# Patient Record
Sex: Female | Born: 1960 | Race: White | Hispanic: No | Smoking: Never smoker
Health system: Southern US, Community
[De-identification: ages and names within clinical notes are randomized; demographics above are authoritative.]

## PROBLEM LIST (undated history)

## (undated) DIAGNOSIS — J189 Pneumonia, unspecified organism: Secondary | ICD-10-CM

## (undated) DIAGNOSIS — R51 Headache: Secondary | ICD-10-CM

## (undated) DIAGNOSIS — M1990 Unspecified osteoarthritis, unspecified site: Secondary | ICD-10-CM

## (undated) DIAGNOSIS — F419 Anxiety disorder, unspecified: Secondary | ICD-10-CM

## (undated) DIAGNOSIS — R519 Headache, unspecified: Secondary | ICD-10-CM

## (undated) DIAGNOSIS — F32A Depression, unspecified: Secondary | ICD-10-CM

## (undated) DIAGNOSIS — J45909 Unspecified asthma, uncomplicated: Secondary | ICD-10-CM

## (undated) DIAGNOSIS — T7840XA Allergy, unspecified, initial encounter: Secondary | ICD-10-CM

## (undated) HISTORY — DX: Headache, unspecified: R51.9

## (undated) HISTORY — DX: Unspecified asthma, uncomplicated: J45.909

## (undated) HISTORY — DX: Headache: R51

## (undated) HISTORY — DX: Allergy, unspecified, initial encounter: T78.40XA

## (undated) HISTORY — PX: COLONOSCOPY: SHX174

## (undated) HISTORY — PX: ESOPHAGOGASTRODUODENOSCOPY: SHX1529

---

## 2003-12-07 ENCOUNTER — Ambulatory Visit: Payer: Self-pay | Admitting: Obstetrics and Gynecology

## 2004-12-27 ENCOUNTER — Ambulatory Visit: Payer: Self-pay | Admitting: Obstetrics and Gynecology

## 2007-03-05 ENCOUNTER — Ambulatory Visit: Payer: Self-pay | Admitting: Obstetrics and Gynecology

## 2008-03-05 ENCOUNTER — Ambulatory Visit: Payer: Self-pay | Admitting: Obstetrics and Gynecology

## 2009-02-09 ENCOUNTER — Ambulatory Visit: Payer: Self-pay | Admitting: Family Medicine

## 2009-11-08 ENCOUNTER — Emergency Department: Payer: Self-pay | Admitting: Emergency Medicine

## 2010-03-02 IMAGING — CR DG CHEST 2V
1 series · 2 of 2 positions shown · non-contrast
Comparison: none

REASON FOR EXAM: cough
COMMENTS:

[Series 1: view not recorded · 0.17mm/px · 2 of 2 slices shown]
[im 1/2]
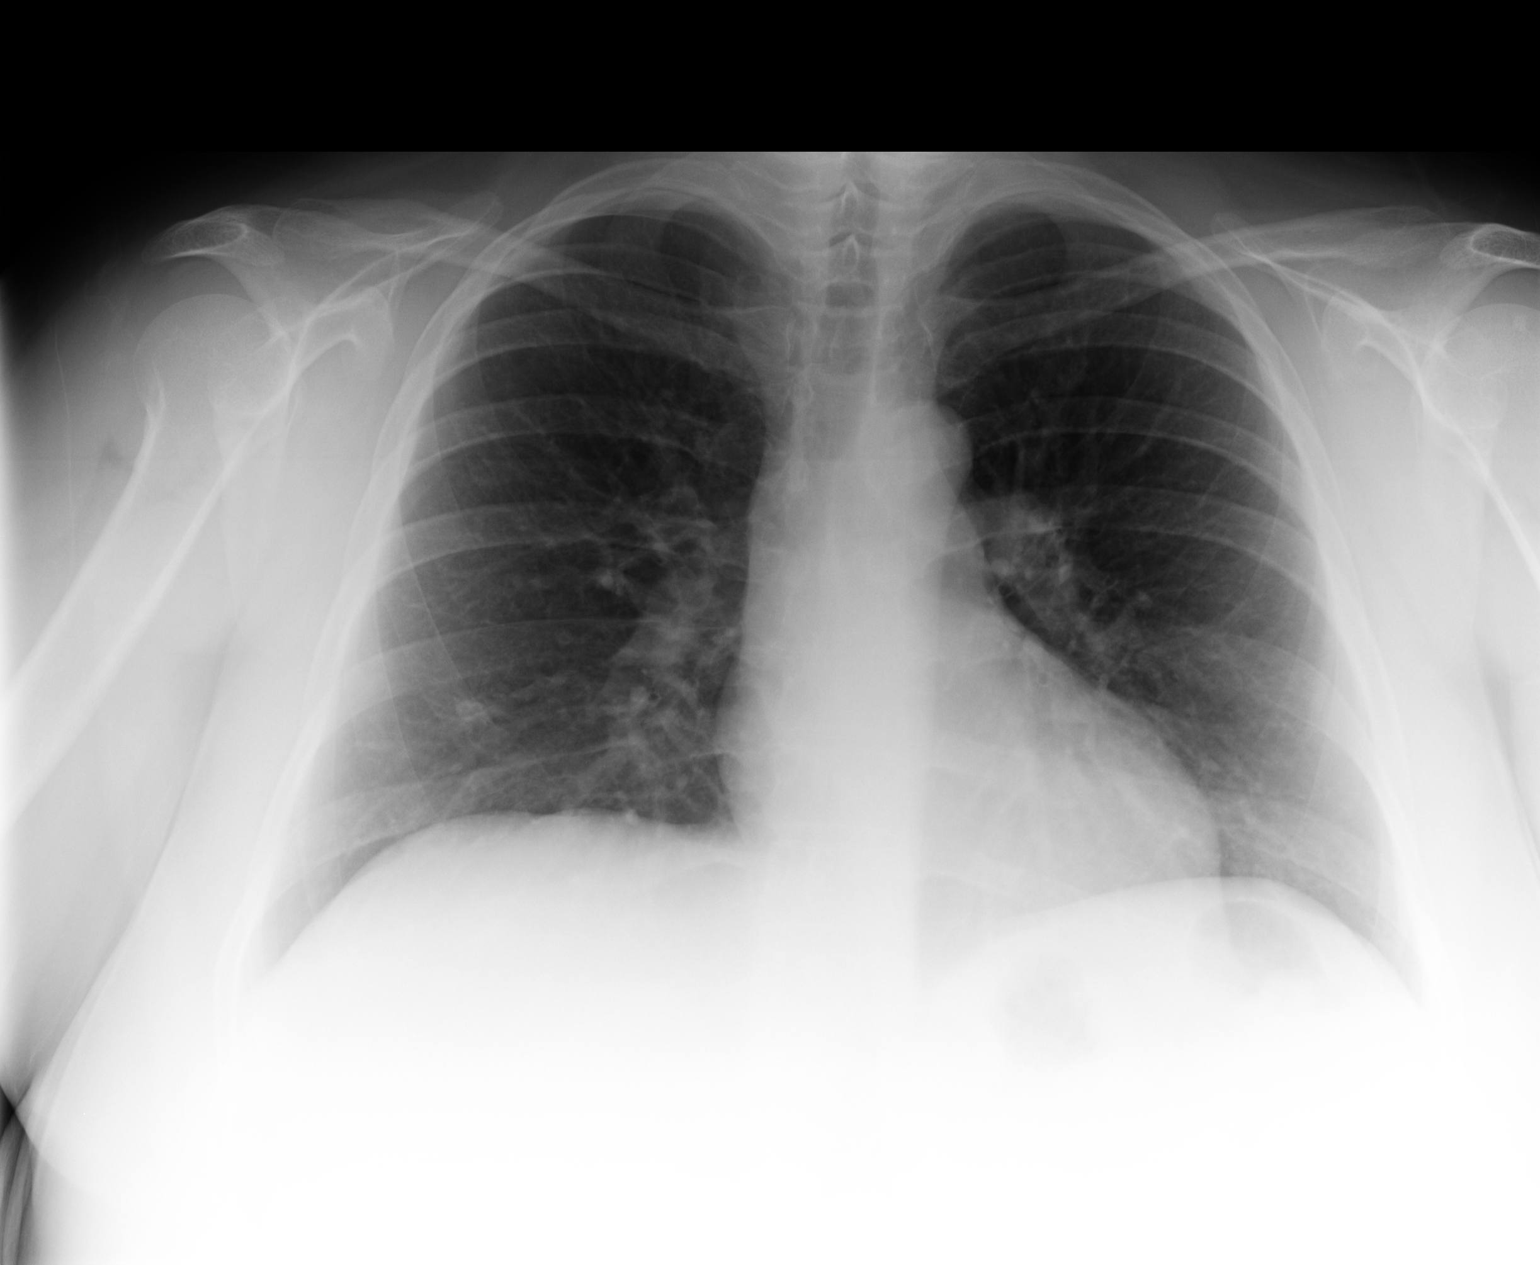
[im 2/2]
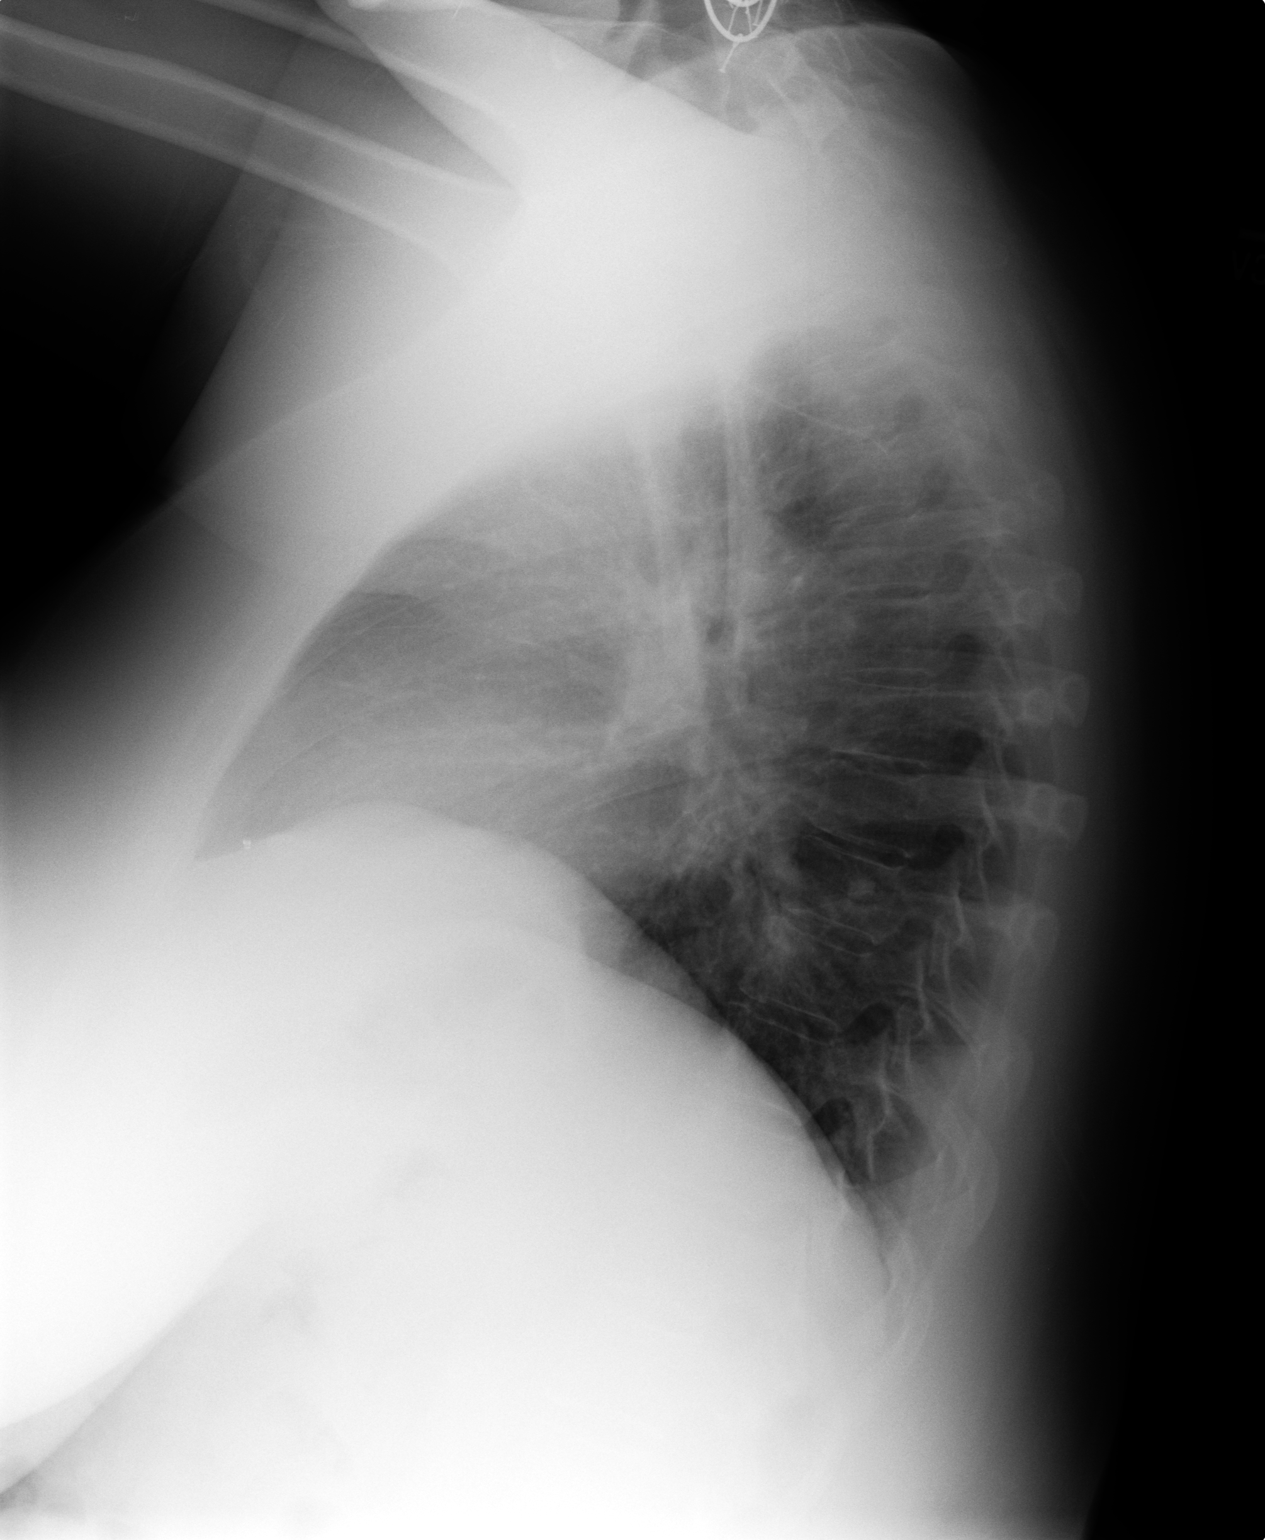

[2 of 2 positions shown; findings below may reference images not displayed]

PROCEDURE:     KDR - KDXR CHEST PA (OR AP) AND LAT  - February 09, 2009  [DATE]

RESULT:     There is a faint density projected over the fifth right anterior
rib. This is thought to represent a confluence of vascular and osseous
markings. The lung fields are clear of infiltrate. The heart, mediastinal
and osseous structures reveal no significant abnormalities.
IMPRESSION: No significant abnormalities are noted.

## 2011-12-28 LAB — HM HEPATITIS C SCREENING LAB: HM Hepatitis Screen: NEGATIVE

## 2011-12-29 ENCOUNTER — Ambulatory Visit: Payer: Self-pay | Admitting: Family Medicine

## 2012-11-07 ENCOUNTER — Ambulatory Visit: Payer: Self-pay | Admitting: Podiatry

## 2012-11-18 ENCOUNTER — Encounter: Payer: Self-pay | Admitting: Podiatry

## 2012-11-20 ENCOUNTER — Ambulatory Visit: Payer: Self-pay | Admitting: Podiatry

## 2012-12-04 ENCOUNTER — Ambulatory Visit (INDEPENDENT_AMBULATORY_CARE_PROVIDER_SITE_OTHER): Payer: BC Managed Care – PPO

## 2012-12-04 ENCOUNTER — Encounter: Payer: Self-pay | Admitting: Podiatry

## 2012-12-04 ENCOUNTER — Ambulatory Visit (INDEPENDENT_AMBULATORY_CARE_PROVIDER_SITE_OTHER): Payer: BC Managed Care – PPO | Admitting: Podiatry

## 2012-12-04 VITALS — BP 135/80 | HR 70 | Resp 16 | Ht 59.0 in

## 2012-12-04 DIAGNOSIS — M79672 Pain in left foot: Secondary | ICD-10-CM

## 2012-12-04 DIAGNOSIS — M722 Plantar fascial fibromatosis: Secondary | ICD-10-CM

## 2012-12-04 DIAGNOSIS — M79609 Pain in unspecified limb: Secondary | ICD-10-CM

## 2012-12-04 MED ORDER — MELOXICAM 15 MG PO TABS: 15.0000 mg | ORAL_TABLET | Freq: Every day | ORAL | Status: DC

## 2012-12-04 MED ORDER — METHYLPREDNISOLONE (PAK) 4 MG PO TABS: ORAL_TABLET | ORAL | Status: DC

## 2012-12-04 NOTE — Progress Notes (Signed)
Carolyn Walton presents today with a chief complaint of a painful left heel x1 year. Painful in the mornings with ambulation and then after she's been sitting for a while. She states it feels better when she wears her heels.  Objective: I have reviewed her past medical history medications and allergies. Vital signs are stable she is alert and oriented x3. Pulses remain palpable left lower extremity. Orthopedic evaluation demonstrates all joints distal to the ankle have a full range of motion without crepitus. She has no pain on medial lateral compression of the calcaneus. She does have pain on direct palpation of the medial calcaneal tubercle at the plantar fascial calcaneal insertion site. Radiographs confirm a plantar distally oriented calcaneal heel spur with soft tissue increase in density indicative of plantar fasciitis.  Assessment: Plantar fasciitis left.  Plan: We discussed the etiology pathology conservative versus surgical therapies. At this point we started her on a Medrol Dosepak for 6 days to be followed by Mobic 15 mg 30 in number one by mouth daily. We injected her left heel today and apply a plantar fascial strapping. Dispensed a night splint discussed appropriate shoe gear stretching exercises and ice therapy for which she was given both oral and written home-going instructions. I will followup with her in one month she will call with questions or concerns.

## 2012-12-04 NOTE — Patient Instructions (Signed)
Plantar Fasciitis (Heel Spur Syndrome) with Rehab The plantar fascia is a fibrous, ligament-like, soft-tissue structure that spans the bottom of the foot. Plantar fasciitis is a condition that causes pain in the foot due to inflammation of the tissue. SYMPTOMS   Pain and tenderness on the underneath side of the foot.  Pain that worsens with standing or walking. CAUSES  Plantar fasciitis is caused by irritation and injury to the plantar fascia on the underneath side of the foot. Common mechanisms of injury include:  Direct trauma to bottom of the foot.  Damage to a small nerve that runs under the foot where the main fascia attaches to the heel bone.  Stress placed on the plantar fascia due to bone spurs. RISK INCREASES WITH:   Activities that place stress on the plantar fascia (running, jumping, pivoting, or cutting).  Poor strength and flexibility.  Improperly fitted shoes.  Tight calf muscles.  Flat feet.  Failure to warm-up properly before activity.  Obesity. PREVENTION  Warm up and stretch properly before activity.  Allow for adequate recovery between workouts.  Maintain physical fitness:  Strength, flexibility, and endurance.  Cardiovascular fitness.  Maintain a health body weight.  Avoid stress on the plantar fascia.  Wear properly fitted shoes, including arch supports for individuals who have flat feet. PROGNOSIS  If treated properly, then the symptoms of plantar fasciitis usually resolve without surgery. However, occasionally surgery is necessary. RELATED COMPLICATIONS   Recurrent symptoms that may result in a chronic condition.  Problems of the lower back that are caused by compensating for the injury, such as limping.  Pain or weakness of the foot during push-off following surgery.  Chronic inflammation, scarring, and partial or complete fascia tear, occurring more often from repeated injections. TREATMENT  Treatment initially involves the use of  ice and medication to help reduce pain and inflammation. The use of strengthening and stretching exercises may help reduce pain with activity, especially stretches of the Achilles tendon. These exercises may be performed at home or with a therapist. Your caregiver may recommend that you use heel cups of arch supports to help reduce stress on the plantar fascia. Occasionally, corticosteroid injections are given to reduce inflammation. If symptoms persist for greater than 6 months despite non-surgical (conservative), then surgery may be recommended.  MEDICATION   If pain medication is necessary, then nonsteroidal anti-inflammatory medications, such as aspirin and ibuprofen, or other minor pain relievers, such as acetaminophen, are often recommended.  Do not take pain medication within 7 days before surgery.  Prescription pain relievers may be given if deemed necessary by your caregiver. Use only as directed and only as much as you need.  Corticosteroid injections may be given by your caregiver. These injections should be reserved for the most serious cases, because they may only be given a certain number of times. HEAT AND COLD  Cold treatment (icing) relieves pain and reduces inflammation. Cold treatment should be applied for 10 to 15 minutes every 2 to 3 hours for inflammation and pain and immediately after any activity that aggravates your symptoms. Use ice packs or massage the area with a piece of ice (ice massage).  Heat treatment may be used prior to performing the stretching and strengthening activities prescribed by your caregiver, physical therapist, or athletic trainer. Use a heat pack or soak the injury in warm water. SEEK IMMEDIATE MEDICAL CARE IF:  Treatment seems to offer no benefit, or the condition worsens.  Any medications produce adverse side effects. EXERCISES RANGE   OF MOTION (ROM) AND STRETCHING EXERCISES - Plantar Fasciitis (Heel Spur Syndrome) These exercises may help you  when beginning to rehabilitate your injury. Your symptoms may resolve with or without further involvement from your physician, physical therapist or athletic trainer. While completing these exercises, remember:   Restoring tissue flexibility helps normal motion to return to the joints. This allows healthier, less painful movement and activity.  An effective stretch should be held for at least 30 seconds.  A stretch should never be painful. You should only feel a gentle lengthening or release in the stretched tissue. RANGE OF MOTION - Toe Extension, Flexion  Sit with your right / left leg crossed over your opposite knee.  Grasp your toes and gently pull them back toward the top of your foot. You should feel a stretch on the bottom of your toes and/or foot.  Hold this stretch for __________ seconds.  Now, gently pull your toes toward the bottom of your foot. You should feel a stretch on the top of your toes and or foot.  Hold this stretch for __________ seconds. Repeat __________ times. Complete this stretch __________ times per day.  RANGE OF MOTION - Ankle Dorsiflexion, Active Assisted  Remove shoes and sit on a chair that is preferably not on a carpeted surface.  Place right / left foot under knee. Extend your opposite leg for support.  Keeping your heel down, slide your right / left foot back toward the chair until you feel a stretch at your ankle or calf. If you do not feel a stretch, slide your bottom forward to the edge of the chair, while still keeping your heel down.  Hold this stretch for __________ seconds. Repeat __________ times. Complete this stretch __________ times per day.  STRETCH  Gastroc, Standing  Place hands on wall.  Extend right / left leg, keeping the front knee somewhat bent.  Slightly point your toes inward on your back foot.  Keeping your right / left heel on the floor and your knee straight, shift your weight toward the wall, not allowing your back to  arch.  You should feel a gentle stretch in the right / left calf. Hold this position for __________ seconds. Repeat __________ times. Complete this stretch __________ times per day. STRETCH  Soleus, Standing  Place hands on wall.  Extend right / left leg, keeping the other knee somewhat bent.  Slightly point your toes inward on your back foot.  Keep your right / left heel on the floor, bend your back knee, and slightly shift your weight over the back leg so that you feel a gentle stretch deep in your back calf.  Hold this position for __________ seconds. Repeat __________ times. Complete this stretch __________ times per day. STRETCH  Gastrocsoleus, Standing  Note: This exercise can place a lot of stress on your foot and ankle. Please complete this exercise only if specifically instructed by your caregiver.   Place the ball of your right / left foot on a step, keeping your other foot firmly on the same step.  Hold on to the wall or a rail for balance.  Slowly lift your other foot, allowing your body weight to press your heel down over the edge of the step.  You should feel a stretch in your right / left calf.  Hold this position for __________ seconds.  Repeat this exercise with a slight bend in your right / left knee. Repeat __________ times. Complete this stretch __________ times per day.    STRENGTHENING EXERCISES - Plantar Fasciitis (Heel Spur Syndrome)  These exercises may help you when beginning to rehabilitate your injury. They may resolve your symptoms with or without further involvement from your physician, physical therapist or athletic trainer. While completing these exercises, remember:   Muscles can gain both the endurance and the strength needed for everyday activities through controlled exercises.  Complete these exercises as instructed by your physician, physical therapist or athletic trainer. Progress the resistance and repetitions only as guided. STRENGTH - Towel  Curls  Sit in a chair positioned on a non-carpeted surface.  Place your foot on a towel, keeping your heel on the floor.  Pull the towel toward your heel by only curling your toes. Keep your heel on the floor.  If instructed by your physician, physical therapist or athletic trainer, add ____________________ at the end of the towel. Repeat __________ times. Complete this exercise __________ times per day. STRENGTH - Ankle Inversion  Secure one end of a rubber exercise band/tubing to a fixed object (table, pole). Loop the other end around your foot just before your toes.  Place your fists between your knees. This will focus your strengthening at your ankle.  Slowly, pull your big toe up and in, making sure the band/tubing is positioned to resist the entire motion.  Hold this position for __________ seconds.  Have your muscles resist the band/tubing as it slowly pulls your foot back to the starting position. Repeat __________ times. Complete this exercises __________ times per day.  Document Released: 12/19/2004 Document Revised: 03/13/2011 Document Reviewed: 04/02/2008 [employer] Patient Information 2014 [employer]. Plantar Fasciitis Plantar fasciitis is a common condition that causes foot pain. It is soreness (inflammation) of the band of tough fibrous tissue on the bottom of the foot that runs from the heel bone (calcaneus) to the ball of the foot. The cause of this soreness may be from excessive standing, poor fitting shoes, running on hard surfaces, being overweight, having an abnormal walk, or overuse (this is common in runners) of the painful foot or feet. It is also common in aerobic exercise dancers and ballet dancers. SYMPTOMS  Most people with plantar fasciitis complain of:  Severe pain in the morning on the bottom of their foot especially when taking the first steps out of bed. This pain recedes after a few minutes of walking.  Severe pain is experienced also during walking  following a long period of inactivity.  Pain is worse when walking barefoot or up stairs DIAGNOSIS   Your caregiver will diagnose this condition by examining and feeling your foot.  Special tests such as X-rays of your foot, are usually not needed. PREVENTION   Consult a sports medicine professional before beginning a new exercise program.  Walking programs offer a good workout. With walking there is a lower chance of overuse injuries common to runners. There is less impact and less jarring of the joints.  Begin all new exercise programs slowly. If problems or pain develop, decrease the amount of time or distance until you are at a comfortable level.  Wear good shoes and replace them regularly.  Stretch your foot and the heel cords at the back of the ankle (Achilles tendon) both before and after exercise.  Run or exercise on even surfaces that are not hard. For example, asphalt is better than pavement.  Do not run barefoot on hard surfaces.  If using a treadmill, vary the incline.  Do not continue to workout if you have foot or joint   problems. Seek professional help if they do not improve. HOME CARE INSTRUCTIONS   Avoid activities that cause you pain until you recover.  Use ice or cold packs on the problem or painful areas after working out.  Only take over-the-counter or prescription medicines for pain, discomfort, or fever as directed by your caregiver.  Soft shoe inserts or athletic shoes with air or gel sole cushions may be helpful.  If problems continue or become more severe, consult a sports medicine caregiver or your own health care provider. Cortisone is a potent anti-inflammatory medication that may be injected into the painful area. You can discuss this treatment with your caregiver. MAKE SURE YOU:   Understand these instructions.  Will watch your condition.  Will get help right away if you are not doing well or get worse. Document Released: 09/13/2000 Document  Revised: 03/13/2011 Document Reviewed: 11/13/2007 [employer] Patient Information 2014 [employer].  

## 2013-01-01 ENCOUNTER — Ambulatory Visit (INDEPENDENT_AMBULATORY_CARE_PROVIDER_SITE_OTHER): Payer: BC Managed Care – PPO | Admitting: Podiatry

## 2013-01-01 ENCOUNTER — Encounter: Payer: Self-pay | Admitting: Podiatry

## 2013-01-01 DIAGNOSIS — M722 Plantar fascial fibromatosis: Secondary | ICD-10-CM

## 2013-01-01 NOTE — Progress Notes (Signed)
   Subjective:    Patient ID: Carolyn Walton, female    DOB: [birth year], 52 y.o.   MRN: [id number]  HPI Comments: Its been wonderful, left heel 90% better      Review of Systems     Objective:   Physical Exam: Vital signs are stable she is alert and oriented x3. She has no pain on palpation medial continued tubercle of the left heel.        Assessment & Plan:  Assessment: Resolving plantar fasciitis 90%.  Plan: Continue all conservative therapies and will followup with her on an as-needed basis.

## 2013-04-21 ENCOUNTER — Ambulatory Visit: Payer: BC Managed Care – PPO | Admitting: Podiatry

## 2013-04-30 ENCOUNTER — Encounter: Payer: Self-pay | Admitting: Podiatry

## 2013-04-30 ENCOUNTER — Ambulatory Visit (INDEPENDENT_AMBULATORY_CARE_PROVIDER_SITE_OTHER): Payer: BC Managed Care – PPO | Admitting: Podiatry

## 2013-04-30 ENCOUNTER — Other Ambulatory Visit: Payer: Self-pay | Admitting: *Deleted

## 2013-04-30 VITALS — BP 122/86 | HR 82 | Resp 16

## 2013-04-30 DIAGNOSIS — M722 Plantar fascial fibromatosis: Secondary | ICD-10-CM

## 2013-04-30 MED ORDER — DICLOFENAC SODIUM 75 MG PO TBEC: 75.0000 mg | DELAYED_RELEASE_TABLET | Freq: Two times a day (BID) | ORAL | Status: DC

## 2013-04-30 NOTE — Progress Notes (Signed)
She presents today complaining of bilateral heel pain. She continues all conservative therapies.  Objective: Vital signs are stable she is alert and oriented x3. She has pain on palpation medial continued tubercles bilateral. No calf pain. Pulses remain palpable.  Assessment: Plantar fasciitis bilateral.  Plan: Discussed etiology pathology conservative or surgical therapies. We performed orthotic scan today after bilateral injections. She was also dispensed a plantar fascial brace.

## 2013-10-10 ENCOUNTER — Emergency Department: Payer: Self-pay | Admitting: Emergency Medicine

## 2013-10-10 LAB — TROPONIN I: Troponin-I: <0.02 ng/mL

## 2013-10-10 LAB — CBC WITH DIFFERENTIAL/PLATELET
Basophil #: 0 10*3/uL (ref 0.0–0.1)
Basophil %: 0.5 %
Eosinophil #: 0.3 10*3/uL (ref 0.0–0.7)
Eosinophil %: 5.3 %
HCT: 42.8 % (ref 35.0–47.0)
HGB: 13.6 g/dL (ref 12.0–16.0)
Lymphocyte #: 1.7 10*3/uL (ref 1.0–3.6)
Lymphocyte %: 26.9 %
MCH: 28 pg (ref 26.0–34.0)
MCHC: 31.8 g/dL — ABNORMAL LOW (ref 32.0–36.0)
MCV: 88 fL (ref 80–100)
Monocyte #: 0.4 x10 3/mm (ref 0.2–0.9)
Monocyte %: 6.9 %
Neutrophil #: 3.9 10*3/uL (ref 1.4–6.5)
Neutrophil %: 60.4 %
Platelet: 173 10*3/uL (ref 150–440)
RBC: 4.86 10*6/uL (ref 3.80–5.20)
RDW: 13.5 % (ref 11.5–14.5)
WBC: 6.4 10*3/uL (ref 3.6–11.0)

## 2013-10-10 LAB — BASIC METABOLIC PANEL
Anion Gap: 8 (ref 7–16)
BUN: 15 mg/dL (ref 7–18)
CALCIUM: 9.2 mg/dL (ref 8.5–10.1)
CO2: 28 mmol/L (ref 21–32)
Chloride: 107 mmol/L (ref 98–107)
Creatinine: 0.7 mg/dL (ref 0.60–1.30)
EGFR (African American): >60
GLUCOSE: 81 mg/dL (ref 65–99)
Osmolality: 285 (ref 275–301)
Potassium: 3.9 mmol/L (ref 3.5–5.1)
SODIUM: 143 mmol/L (ref 136–145)

## 2013-10-10 LAB — URINALYSIS, COMPLETE
Bilirubin,UR: NEGATIVE
Blood: NEGATIVE
Glucose,UR: NEGATIVE mg/dL (ref 0–75)
Ketone: NEGATIVE
Nitrite: NEGATIVE
PH: 7 (ref 4.5–8.0)
Protein: NEGATIVE
RBC,UR: 2 /HPF (ref 0–5)
Specific Gravity: 1.004 (ref 1.003–1.030)
Squamous Epithelial: 3

## 2013-10-10 LAB — SEDIMENTATION RATE: ERYTHROCYTE SED RATE: 9 mm/h (ref 0–30)

## 2013-11-11 ENCOUNTER — Ambulatory Visit: Payer: Self-pay | Admitting: Family Medicine

## 2013-12-29 ENCOUNTER — Other Ambulatory Visit: Payer: Self-pay | Admitting: *Deleted

## 2013-12-29 ENCOUNTER — Telehealth: Payer: Self-pay | Admitting: *Deleted

## 2013-12-29 MED ORDER — DICLOFENAC SODIUM 75 MG PO TBEC: 75.0000 mg | DELAYED_RELEASE_TABLET | Freq: Two times a day (BID) | ORAL | Status: DC

## 2013-12-29 MED ORDER — MELOXICAM 15 MG PO TABS: 15.0000 mg | ORAL_TABLET | Freq: Every day | ORAL | Status: DC

## 2013-12-29 NOTE — Telephone Encounter (Signed)
Refill for diclofenac come from pharmacy ok to fill

## 2013-12-29 NOTE — Telephone Encounter (Signed)
We just got 2 e-scripts.  One was Meloxicam and the other was Diclofenac.  Of course they're both NSAIDS, I don't think she'll need both of them.  I don't know if the 2nd one replaced the first one.  I got the Meloxicam first then later the Diclofenac came through.  Call and let me know what to do.

## 2013-12-29 NOTE — Telephone Encounter (Signed)
Ok to refill mobic. 

## 2013-12-29 NOTE — Telephone Encounter (Signed)
Diclofenac 75mg . One by mouth twice daily. #60 3 rf

## 2014-04-11 ENCOUNTER — Ambulatory Visit
Admit: 2014-04-11 | Disposition: A | Discharge status: Home / Self Care | Payer: Self-pay | Attending: Orthopedic Surgery | Admitting: Orthopedic Surgery

## 2014-05-21 ENCOUNTER — Ambulatory Visit
Admission: RE | Admit: 2014-05-21 | Discharge: 2014-05-21 | Disposition: A | Discharge status: Home / Self Care | Payer: BC Managed Care – PPO | Source: Ambulatory Visit | Attending: Family Medicine | Admitting: Family Medicine

## 2014-05-21 ENCOUNTER — Other Ambulatory Visit: Payer: Self-pay | Admitting: Family Medicine

## 2014-05-21 DIAGNOSIS — J189 Pneumonia, unspecified organism: Secondary | ICD-10-CM

## 2014-05-21 DIAGNOSIS — Z8701 Personal history of pneumonia (recurrent): Secondary | ICD-10-CM | POA: Diagnosis not present

## 2014-05-21 DIAGNOSIS — Z09 Encounter for follow-up examination after completed treatment for conditions other than malignant neoplasm: Secondary | ICD-10-CM | POA: Diagnosis not present

## 2014-06-08 ENCOUNTER — Inpatient Hospital Stay: Admission: RE | Admit: 2014-06-08 | Payer: Self-pay | Source: Ambulatory Visit

## 2014-06-08 ENCOUNTER — Encounter: Payer: Self-pay | Admitting: *Deleted

## 2014-06-08 DIAGNOSIS — Z7952 Long term (current) use of systemic steroids: Secondary | ICD-10-CM | POA: Diagnosis not present

## 2014-06-08 DIAGNOSIS — Z79899 Other long term (current) drug therapy: Secondary | ICD-10-CM | POA: Diagnosis not present

## 2014-06-08 DIAGNOSIS — S83242A Other tear of medial meniscus, current injury, left knee, initial encounter: Secondary | ICD-10-CM | POA: Diagnosis present

## 2014-06-08 DIAGNOSIS — X58XXXA Exposure to other specified factors, initial encounter: Secondary | ICD-10-CM | POA: Diagnosis not present

## 2014-06-08 DIAGNOSIS — Z791 Long term (current) use of non-steroidal anti-inflammatories (NSAID): Secondary | ICD-10-CM | POA: Diagnosis not present

## 2014-06-08 DIAGNOSIS — J45909 Unspecified asthma, uncomplicated: Secondary | ICD-10-CM | POA: Diagnosis not present

## 2014-06-08 DIAGNOSIS — M1990 Unspecified osteoarthritis, unspecified site: Secondary | ICD-10-CM | POA: Diagnosis not present

## 2014-06-08 NOTE — Patient Instructions (Signed)
  Your procedure is scheduled on: 6/15/16Report to Day Surgery. To find out your arrival time please call [phone] between 1PM - 3PM on 06/16/14.  Remember: Instructions that are not followed completely may result in serious medical risk, up to and including death, or upon the discretion of your surgeon and anesthesiologist your surgery may need to be rescheduled.    _x___ 1. Do not eat food or drink liquids after midnight. No gum chewing or hard candies.     _x___ 2. No Alcohol for 24 hours before or after surgery.   ____ 3. Bring all medications with you on the day of surgery if instructed.    __x__ 4. Notify your doctor if there is any change in your medical condition     (cold, fever, infections).     Do not wear jewelry, make-up, hairpins, clips or nail polish.  Do not wear lotions, powders, or perfumes. You may wear deodorant.  Do not shave 48 hours prior to surgery. Men may shave face and neck.  Do not bring valuables to the hospital.    [facility] is not responsible for any belongings or valuables.               Contacts, dentures or bridgework may not be worn into surgery.  Leave your suitcase in the car. After surgery it may be brought to your room.  For patients admitted to the hospital, discharge time is determined by your                treatment team.   Patients discharged the day of surgery will not be allowed to drive home.   Please read over the following fact sheets that you were given:   Surgical Site Infection Prevention   ____ Take these medicines the morning of surgery with A SIP OF WATER:    1. montelukast  2.   3.   4.  5.  6.  ____ Fleet Enema (as directed)   ___x_ Use CHG Soap as directed  _x___ Use inhalers on the day of surgery  ____ Stop metformin 2 days prior to surgery    ____ Take 1/2 of usual insulin dose the night before surgery and none on the morning of surgery.   ____ Stop Coumadin/Plavix/aspirin on   __x__ Stop  Anti-inflammatories on 06/09/14   ____ Stop supplements until after surgery.    ____ Bring C-Pap to the hospital.

## 2014-06-15 ENCOUNTER — Encounter
Admission: RE | Admit: 2014-06-15 | Discharge: 2014-06-15 | Disposition: A | Discharge status: Home / Self Care | Payer: BC Managed Care – PPO | Source: Ambulatory Visit | Attending: Orthopedic Surgery | Admitting: Orthopedic Surgery

## 2014-06-15 DIAGNOSIS — S83242A Other tear of medial meniscus, current injury, left knee, initial encounter: Secondary | ICD-10-CM | POA: Diagnosis not present

## 2014-06-15 LAB — URINALYSIS COMPLETE WITH MICROSCOPIC (ARMC ONLY)
BILIRUBIN URINE: NEGATIVE
Glucose, UA: NEGATIVE mg/dL
Hgb urine dipstick: NEGATIVE
Ketones, ur: NEGATIVE mg/dL
LEUKOCYTES UA: NEGATIVE
Nitrite: NEGATIVE
Protein, ur: NEGATIVE mg/dL
Specific Gravity, Urine: 1.015 (ref 1.005–1.030)
pH: 6 (ref 5.0–8.0)

## 2014-06-15 LAB — BASIC METABOLIC PANEL
ANION GAP: 5 (ref 5–15)
BUN: 13 mg/dL (ref 6–20)
CALCIUM: 8.9 mg/dL (ref 8.9–10.3)
CO2: 29 mmol/L (ref 22–32)
Chloride: 105 mmol/L (ref 101–111)
Creatinine, Ser: 0.73 mg/dL (ref 0.44–1.00)
GFR calc Af Amer: >60 mL/min (ref >60)
GFR calc non Af Amer: >60 mL/min (ref >60)
Glucose, Bld: 94 mg/dL (ref 65–99)
Potassium: 3.8 mmol/L (ref 3.5–5.1)
Sodium: 139 mmol/L (ref 135–145)

## 2014-06-15 LAB — DIFFERENTIAL
BASOS PCT: 0 %
Basophils Absolute: 0 10*3/uL (ref 0–0.1)
EOS ABS: 0.4 10*3/uL (ref 0–0.7)
Eosinophils Relative: 5 %
Lymphocytes Relative: 24 %
Lymphs Abs: 1.8 10*3/uL (ref 1.0–3.6)
MONO ABS: 0.5 10*3/uL (ref 0.2–0.9)
MONOS PCT: 7 %
Neutro Abs: 4.6 10*3/uL (ref 1.4–6.5)
Neutrophils Relative %: 64 %

## 2014-06-15 LAB — APTT: aPTT: 33 seconds (ref 24–36)

## 2014-06-15 LAB — CBC
HCT: 43.4 % (ref 35.0–47.0)
Hemoglobin: 14.1 g/dL (ref 12.0–16.0)
MCH: 27.9 pg (ref 26.0–34.0)
MCHC: 32.4 g/dL (ref 32.0–36.0)
MCV: 86.1 fL (ref 80.0–100.0)
Platelets: 192 10*3/uL (ref 150–440)
RBC: 5.04 MIL/uL (ref 3.80–5.20)
RDW: 13.5 % (ref 11.5–14.5)
WBC: 7.2 10*3/uL (ref 3.6–11.0)

## 2014-06-15 LAB — PROTIME-INR
INR: 0.96
Prothrombin Time: 13 seconds (ref 11.4–15.0)

## 2014-06-17 ENCOUNTER — Encounter
Admission: RE | Disposition: A | Discharge status: Home / Self Care | Payer: Self-pay | Source: Ambulatory Visit | Attending: Orthopedic Surgery

## 2014-06-17 ENCOUNTER — Ambulatory Visit: Payer: BC Managed Care – PPO | Admitting: Anesthesiology

## 2014-06-17 ENCOUNTER — Encounter: Payer: Self-pay | Admitting: *Deleted

## 2014-06-17 ENCOUNTER — Ambulatory Visit
Admission: RE | Admit: 2014-06-17 | Discharge: 2014-06-17 | Disposition: A | Discharge status: Home / Self Care | Payer: BC Managed Care – PPO | Source: Ambulatory Visit | Attending: Orthopedic Surgery | Admitting: Orthopedic Surgery

## 2014-06-17 DIAGNOSIS — Z791 Long term (current) use of non-steroidal anti-inflammatories (NSAID): Secondary | ICD-10-CM | POA: Insufficient documentation

## 2014-06-17 DIAGNOSIS — Z79899 Other long term (current) drug therapy: Secondary | ICD-10-CM | POA: Insufficient documentation

## 2014-06-17 DIAGNOSIS — M1990 Unspecified osteoarthritis, unspecified site: Secondary | ICD-10-CM | POA: Insufficient documentation

## 2014-06-17 DIAGNOSIS — X58XXXA Exposure to other specified factors, initial encounter: Secondary | ICD-10-CM | POA: Insufficient documentation

## 2014-06-17 DIAGNOSIS — S83242A Other tear of medial meniscus, current injury, left knee, initial encounter: Secondary | ICD-10-CM | POA: Insufficient documentation

## 2014-06-17 DIAGNOSIS — J45909 Unspecified asthma, uncomplicated: Secondary | ICD-10-CM | POA: Insufficient documentation

## 2014-06-17 DIAGNOSIS — Z7952 Long term (current) use of systemic steroids: Secondary | ICD-10-CM | POA: Insufficient documentation

## 2014-06-17 HISTORY — PX: KNEE ARTHROSCOPY WITH MEDIAL MENISECTOMY: SHX5651

## 2014-06-17 HISTORY — DX: Unspecified osteoarthritis, unspecified site: M19.90

## 2014-06-17 SURGERY — ARTHROSCOPY, KNEE, WITH MEDIAL MENISCECTOMY
Anesthesia: General | Site: Knee | Laterality: Left | Wound class: Clean

## 2014-06-17 MED ORDER — ONDANSETRON HCL 4 MG/2ML IJ SOLN
INTRAMUSCULAR | Status: DC | PRN
  Administered 2014-06-17: 4 mg via INTRAVENOUS

## 2014-06-17 MED ORDER — PROPOFOL 10 MG/ML IV BOLUS
INTRAVENOUS | Status: DC | PRN
  Administered 2014-06-17: 200 mg via INTRAVENOUS

## 2014-06-17 MED ORDER — FENTANYL CITRATE (PF) 100 MCG/2ML IJ SOLN
INTRAMUSCULAR | Status: AC
  Administered 2014-06-17: 25 ug via INTRAVENOUS
  Filled 2014-06-17: qty 2

## 2014-06-17 MED ORDER — IPRATROPIUM-ALBUTEROL 0.5-2.5 (3) MG/3ML IN SOLN
3.0000 mL | Freq: Once | RESPIRATORY_TRACT | Status: AC
  Administered 2014-06-17: 3 mL via RESPIRATORY_TRACT

## 2014-06-17 MED ORDER — FAMOTIDINE 20 MG PO TABS
ORAL_TABLET | ORAL | Status: AC
  Administered 2014-06-17: 20 mg
  Filled 2014-06-17: qty 1

## 2014-06-17 MED ORDER — HYDROMORPHONE HCL 1 MG/ML IJ SOLN
0.5000 mg | INTRAMUSCULAR | Status: DC | PRN
  Administered 2014-06-17: 0.5 mg via INTRAVENOUS

## 2014-06-17 MED ORDER — ACETAMINOPHEN 10 MG/ML IV SOLN
INTRAVENOUS | Status: AC
  Filled 2014-06-17: qty 100

## 2014-06-17 MED ORDER — ONDANSETRON HCL 4 MG/2ML IJ SOLN: 4.0000 mg | Freq: Once | INTRAMUSCULAR | Status: DC | PRN

## 2014-06-17 MED ORDER — MIDAZOLAM HCL 2 MG/2ML IJ SOLN
INTRAMUSCULAR | Status: DC | PRN
  Administered 2014-06-17: 2 mg via INTRAVENOUS

## 2014-06-17 MED ORDER — HYDROCODONE-ACETAMINOPHEN 5-325 MG PO TABS: 1.0000 | ORAL_TABLET | ORAL | Status: DC | PRN

## 2014-06-17 MED ORDER — CHLORHEXIDINE GLUCONATE 4 % EX LIQD: 60.0000 mL | Freq: Once | CUTANEOUS | Status: DC

## 2014-06-17 MED ORDER — PROMETHAZINE HCL 12.5 MG PO TABS: 12.5000 mg | ORAL_TABLET | ORAL | Status: DC | PRN

## 2014-06-17 MED ORDER — HYDROMORPHONE HCL 1 MG/ML IJ SOLN
INTRAMUSCULAR | Status: AC
  Administered 2014-06-17: 0.5 mg via INTRAVENOUS
  Filled 2014-06-17: qty 1

## 2014-06-17 MED ORDER — SUCCINYLCHOLINE CHLORIDE 20 MG/ML IJ SOLN
INTRAMUSCULAR | Status: DC | PRN
  Administered 2014-06-17: 120 mg via INTRAVENOUS

## 2014-06-17 MED ORDER — BUPIVACAINE-EPINEPHRINE (PF) 0.25% -1:200000 IJ SOLN
INTRAMUSCULAR | Status: AC
  Filled 2014-06-17: qty 30

## 2014-06-17 MED ORDER — LIDOCAINE HCL (PF) 1 % IJ SOLN
INTRAMUSCULAR | Status: AC
  Filled 2014-06-17: qty 30

## 2014-06-17 MED ORDER — FENTANYL CITRATE (PF) 100 MCG/2ML IJ SOLN
25.0000 ug | INTRAMUSCULAR | Status: DC | PRN
  Administered 2014-06-17 (×4): 25 ug via INTRAVENOUS

## 2014-06-17 MED ORDER — CEFAZOLIN SODIUM-DEXTROSE 2-3 GM-% IV SOLR
INTRAVENOUS | Status: AC
  Administered 2014-06-17: 2 g via INTRAVENOUS
  Filled 2014-06-17: qty 50

## 2014-06-17 MED ORDER — LACTATED RINGERS IV SOLN
INTRAVENOUS | Status: DC
  Administered 2014-06-17: 13:00:00 via INTRAVENOUS

## 2014-06-17 MED ORDER — LIDOCAINE HCL (CARDIAC) 20 MG/ML IV SOLN
INTRAVENOUS | Status: DC | PRN
  Administered 2014-06-17: 100 mg via INTRAVENOUS

## 2014-06-17 MED ORDER — IPRATROPIUM-ALBUTEROL 0.5-2.5 (3) MG/3ML IN SOLN
RESPIRATORY_TRACT | Status: AC
  Administered 2014-06-17: 3 mL via RESPIRATORY_TRACT
  Filled 2014-06-17: qty 3

## 2014-06-17 MED ORDER — FENTANYL CITRATE (PF) 100 MCG/2ML IJ SOLN
INTRAMUSCULAR | Status: DC | PRN
  Administered 2014-06-17 (×4): 50 ug via INTRAVENOUS

## 2014-06-17 MED ORDER — CEFAZOLIN SODIUM-DEXTROSE 2-3 GM-% IV SOLR: 2.0000 g | INTRAVENOUS | Status: DC

## 2014-06-17 MED ORDER — LIDOCAINE HCL 1 % IJ SOLN
INTRAMUSCULAR | Status: DC | PRN
  Administered 2014-06-17: 5 mL

## 2014-06-17 MED ORDER — ACETAMINOPHEN 10 MG/ML IV SOLN
INTRAVENOUS | Status: DC | PRN
  Administered 2014-06-17: 1000 mg via INTRAVENOUS

## 2014-06-17 MED ORDER — BUPIVACAINE-EPINEPHRINE 0.25% -1:200000 IJ SOLN
INTRAMUSCULAR | Status: DC | PRN
  Administered 2014-06-17: 20 mL

## 2014-06-17 SURGICAL SUPPLY — 32 items
BRONCHOSCOPE PED SLIM DISP (MISCELLANEOUS) ×2 IMPLANT
BUR RADIUS 3.5 (BURR) IMPLANT
BUR RADIUS 4.0X18.5 (BURR) ×2 IMPLANT
COOLER POLAR GLACIER W/PUMP (MISCELLANEOUS) ×2 IMPLANT
DRAPE IMP U-DRAPE 54X76 (DRAPES) ×2 IMPLANT
DURAPREP 26ML APPLICATOR (WOUND CARE) ×6 IMPLANT
GAUZE PETRO XEROFOAM 1X8 (MISCELLANEOUS) ×2 IMPLANT
GAUZE SPONGE 4X4 12PLY STRL (GAUZE/BANDAGES/DRESSINGS) ×2 IMPLANT
GLOVE BIOGEL PI IND STRL 9 (GLOVE) ×1 IMPLANT
GLOVE BIOGEL PI INDICATOR 9 (GLOVE) ×1
GLOVE SURG 9.0 ORTHO LTXF (GLOVE) ×2 IMPLANT
GOWN STRL REUS W/ TWL LRG LVL3 (GOWN DISPOSABLE) ×1 IMPLANT
GOWN STRL REUS W/TWL 2XL LVL3 (GOWN DISPOSABLE) ×2 IMPLANT
GOWN STRL REUS W/TWL LRG LVL3 (GOWN DISPOSABLE) ×1
IV LACTATED RINGER IRRG 3000ML (IV SOLUTION) ×8
IV LR IRRIG 3000ML ARTHROMATIC (IV SOLUTION) ×8 IMPLANT
KIT RM TURNOVER STRD PROC AR (KITS) ×2 IMPLANT
MANIFOLD NEPTUNE II (INSTRUMENTS) ×2 IMPLANT
PACK ARTHROSCOPY KNEE (MISCELLANEOUS) ×2 IMPLANT
PAD ABD DERMACEA PRESS 5X9 (GAUZE/BANDAGES/DRESSINGS) ×4 IMPLANT
PAD WRAPON POLAR KNEE (MISCELLANEOUS) ×1 IMPLANT
SET TUBE SUCT SHAVER OUTFL 24K (TUBING) ×2 IMPLANT
SOL PREP PVP 2OZ (MISCELLANEOUS) ×2
SOLUTION PREP PVP 2OZ (MISCELLANEOUS) ×1 IMPLANT
STRIP CLOSURE SKIN 1/2X4 (GAUZE/BANDAGES/DRESSINGS) ×2 IMPLANT
SUT ETHILON 4-0 (SUTURE) ×1
SUT ETHILON 4-0 FS2 18XMFL BLK (SUTURE) ×1
SUTURE ETHLN 4-0 FS2 18XMF BLK (SUTURE) ×1 IMPLANT
TUBING ARTHRO INFLOW-ONLY STRL (TUBING) ×2 IMPLANT
WAND HAND CNTRL MULTIVAC 50 (MISCELLANEOUS) ×2 IMPLANT
WAND HAND CNTRL MULTIVAC 90 (MISCELLANEOUS) ×2 IMPLANT
WRAPON POLAR PAD KNEE (MISCELLANEOUS) ×2

## 2014-06-17 NOTE — Anesthesia Procedure Notes (Signed)
Procedure Name: Intubation Performed by: Malva Cogan Pre-anesthesia Checklist: Patient identified, Emergency Drugs available, Suction available, Timeout performed and Patient being monitored Patient Re-evaluated:Patient Re-evaluated prior to inductionOxygen Delivery Method: Circle system utilized Preoxygenation: Pre-oxygenation with 100% oxygen Intubation Type: IV induction, Rapid sequence and Cricoid Pressure applied Laryngoscope Size: Mac and 3 Grade View: Grade III Tube type: Oral Tube size: 7.0 mm Number of attempts: 2 Airway Equipment and Method: Bougie stylet Placement Confirmation: positive ETCO2,  CO2 detector,  breath sounds checked- equal and bilateral and ETT inserted through vocal cords under direct vision Secured at: 21 cm Tube secured with: Tape Difficulty Due To: Difficult Airway- due to anterior larynx

## 2014-06-17 NOTE — Op Note (Signed)
05/21/2014  2:42 PM  PATIENT:  Carolyn Walton  PRE-OPERATIVE DIAGNOSIS:  TEAR OF MEDIAL MENISCUS, LEFT KNEE  POST-OPERATIVE DIAGNOSIS:  Same  PROCEDURE:  LEFT KNEE ARTHROSCOPY WITH  Partial MEDIAL MENISECTOMY, synovectomy  SURGEON:  Thornton Park, MD  ANESTHESIA:   General  PREOPERATIVE INDICATIONS:  Carolyn Walton  54 y.o. female with a diagnosis of TEAR OF MEDIAL MENISCUS, LEFT KNEE, failed conservative management and elected for surgical management.   MRI had demonstrated a horizontal medial meniscus tear with a para-meniscal cyst. Patient's clinical symptoms have been worsening. They're affecting her activities of daily living including ambulation.  The risks benefits and alternatives were discussed with the patient preoperatively including the risks of infection, bleeding, nerve injury, knee stiffness, persistent pain, osteoarthritis and the need for further surgery. Medical  risks include DVT and pulmonary embolism, myocardial infarction, stroke, pneumonia, respiratory failure and death. The patient understood these risks and wished to proceed.  OPERATIVE FINDINGS: A tear of the body and posterior horn medial meniscus.  Parameniscal cyst and diffuse medial femoral condyle and patellofemoral cartilage wear.  OPERATIVE PROCEDURE: Patient was met in the preoperative area. The operative extremity was signed with the word yes and my initials according the hospital's correct site of surgery protocol.  She was brought in the operating room where she was placed supine on the operative table. She underwent general anesthesia with an endotracheal tube..  The patient was prepped and draped in a sterile fashion.  A timeout was performed to verify the patient's name, date of birth, medical record number, correct site of surgery correct procedure to be performed. It was also used to verify the patient had had received antibiotics that all appropriate instruments, and radiographic studies were  available in the room. Once all in attendance were in agreement case began.  Proposed arthroscopy incisions were drawn out with a surgical marker. These were injected with 1% lidocaine plain. An 11 blade was used to establish an inferior lateral and inferomedial portals. The inferomedial portal was created using a 18-gauge spinal needle under direct visualization.  A full diagnostic examination of the knee was performed including the suprapatellar pouch, patellofemoral joint, medial lateral compartments as well as the medial lateral gutters, the intercondylar notch in the posterior knee.  Findings on arthroscopy included diffuse synovitis and diffuse degenerative changes of the undersurface of the patella and medial femoral condyle. Patient had a torn medial meniscus involving the body and posterior horn of the medial meniscus. Horizontal tear exiting the inferior surface and extended to the periphery.  The lateral compartment had no evidence of lateral meniscus tear. There were grade 1 chondral changes in the lateral tibial plateau. The anterior cruciate ligament was intact.  Patient had the medial meniscal tear treated with a 4-0 resector shaver blade and straight duckbill basket instrument. The meniscus was debrided until a stable rim was achieved. A chondroplasty was performed of the medial femoral condyle, and undersurface of patella also using a 4-0 resector shaver blade. A partial synovectomy was also performed using a 4-0 resector shaver blade and 90 ArthroCare wand.   A blunt nerve hook was used to decompress the parameniscal cyst by penetrating the capsule of the knee at the meniscal capsular junction. Cyst fluid was observed draining into the knee joint and was debrided with the 4-0 resector.  The knee was then copiously lavaged and all meniscal debris was removed using the shaver. All arthroscopic  Instruments were then removed. The 2 arthroscopy portals were closed  with 4-0 nylon.  Steri-Strips were applied along with a dry sterile and compressive dressing.  A Polar Care was applied to the left knee.The patient was brought to the PACU in stable condition. I scrubbed and present the entire case and all sharp and instrument counts were correct at the conclusion the case. I spoke to the patient's family postoperatively to let them know the case it done without complication and the patient was stable in the recovery room.  Timoteo Gaul, MD

## 2014-06-17 NOTE — Transfer of Care (Signed)
Immediate Anesthesia Transfer of Care Note  Patient: Carolyn Walton  Procedure(s) Performed: Procedure(s) with comments: KNEE ARTHROSCOPY WITH MEDIAL MENISECTOMY (Left) - partial medial menisectomy  Patient Location: PACU  Anesthesia Type:General  Level of Consciousness: awake, alert  and oriented  Airway & Oxygen Therapy: Patient Spontanous Breathing and Patient connected to face mask oxygen  Post-op Assessment: Report given to RN and Post -op Vital signs reviewed and stable  Post vital signs: Reviewed and stable  Last Vitals:  Filed Vitals:   06/17/14 1500  BP: 140/73  Pulse: 73  Temp: 36.8 C  Resp: 17    Complications: No apparent anesthesia complications

## 2014-06-17 NOTE — H&P (Addendum)
PREOPERATIVE H&P  Chief Complaint: TEAR OF MEDIAL MENISCUS, left knee  HPI: Carolyn Walton is a 54 y.o. female who presents for preoperative history and physical with a diagnosis of TEAR OF LEFT KNEE MEDIAL MENISCUS. Symptoms are rated as moderate to severe, and have been worsening.  This is significantly impairing activities of daily living.  She has elected for surgical management after failing nonoperative management. Her MRI has demonstrated a horizontal tear of the body of the medial meniscus with a parameniscal cyst.  Past Medical History  Diagnosis Date  . Frequent headaches   . Allergy   . Asthma   . Arthritis    Past Surgical History  Procedure Laterality Date  . Cesarean section      times 2   History   Social History  . Marital Status: Married    Spouse Name: N/A  . Number of Children: N/A  . Years of Education: N/A   Social History Main Topics  . Smoking status: Never Smoker   . Smokeless tobacco: Never Used  . Alcohol Use: Yes     Comment: wine  . Drug Use: No  . Sexual Activity: Yes    Birth Control/ Protection: Post-menopausal, None   Other Topics Concern  . None   Social History Narrative   History reviewed. No pertinent family history. Allergies  Allergen Reactions  . Molds & Smuts   . Other     CATS AND DOGS   Prior to Admission medications   Medication Sig Start Date End Date Taking? Authorizing Provider  cetirizine (ZYRTEC) 10 MG tablet Take 10 mg by mouth daily.   Yes Historical Provider, MD  Cholecalciferol (VITAMIN D3) 5000 UNITS TABS Take 5,000 Units by mouth daily.   Yes Historical Provider, MD  Fluticasone-Salmeterol (ADVAIR) 250-50 MCG/DOSE AEPB Inhale 1 puff into the lungs daily.   Yes Historical Provider, MD  meloxicam (MOBIC) 15 MG tablet Take 15 mg by mouth daily.   Yes Historical Provider, MD  montelukast (SINGULAIR) 10 MG tablet Take 10 mg by mouth at bedtime.   Yes Historical Provider, MD  Albuterol (VENTOLIN IN) Inhale into the  lungs.    Historical Provider, MD  citalopram (CELEXA) 20 MG tablet  02/03/13   Historical Provider, MD  diclofenac (VOLTAREN) 75 MG EC tablet Take 1 tablet (75 mg total) by mouth 2 (two) times daily. 12/29/13   Max T Hyatt, DPM  methylPREDNIsolone (MEDROL DOSPACK) 4 MG tablet follow package directions 12/04/12   Max T Hyatt, DPM  naproxen (NAPROSYN) 500 MG tablet Take 500 mg by mouth 2 (two) times daily with a meal.    Historical Provider, MD  SUMAtriptan (IMITREX) 100 MG tablet  01/29/13   Historical Provider, MD  TAMIFLU 75 MG capsule  02/25/13   Historical Provider, MD  topiramate (TOPAMAX) 100 MG tablet Take 100 mg by mouth daily.    Historical Provider, MD     Positive ROS: All other systems have been reviewed and were otherwise negative with the exception of those mentioned in the HPI and as above.  Physical Exam: General: Alert, no acute distress, alert and oriented 3  Cardiovascular: Regular rate and rhythm, no murmurs rubs or gallops No pedal edema Respiratory: Clear to auscultation bilaterally, no wheezes rales or rhonchi No cyanosis, no use of accessory musculature GI: No organomegaly, abdomen is soft and non-tender, nondistended with positive bowel sounds Skin: Intact, No lesions in the area of chief complaint Neurologic: Sensation intact distally Psychiatric: Patient is competent  for consent with normal mood and affect Lymphatic: No axillary or cervical lymphadenopathy  MUSCULOSKELETAL: Left knee: Patient's skin is intact. She has range of motion from 0-110. She has point tenderness over the medial joint line. There is no ligamentous laxity. She has a positive McMurray's test. She has 5 out of 5 strength, 6, intact sensation to light touch in palpable pedal pulses. She still, crepitus but no grind or apprehension.  Assessment: TEAR OF MEDIAL MENISCUS, left knee  Plan: Plan for Procedure(s): Left KNEE ARTHROSCOPY WITH MEDIAL MENISECTOMY  I have discussed in detail the  diagnosis of a medial meniscus tear with the patient. She has failed nonoperative management and wishes to proceed with arthroscopic partial medial meniscectomy. I reviewed the details of surgery as well as the postoperative course with her.  I discussed the risks and benefits of surgery with the patient in my office prior to the date of surgery. She understands the risks include but are not limited to infection, bleeding requiring blood transfusion, nerve or blood vessel injury, malunion, nonunion, joint stiffness or loss of motion, persistent pain, weakness or instability, and the need for further surgery. Medical risks include but are not limited to DVT and pulmonary embolism, myocardial infarction, stroke, pneumonia, respiratory failure and death. Patient understood these risks and wished to proceed.   Juanell Fairly, MD   06/17/2014 12:25 PM

## 2014-06-17 NOTE — Discharge Instructions (Addendum)
Patient will use crutches for the next few days until they is able to walk without a limp. Patient may weight-bear as tolerated after 2-3 days. Patiient should elevate and ice their leg with Polar Care whenever possible over the next 3 days. Patient should keep the surgical dressing covered during showers for 3 days with either a plastic bag or Saran wrap. The bandage may be removed after 3 days. Leave Steri-Strips in place. Take enteric-coated aspirin 325 mg by mouth daily 6 weeks postop for DVT prophylaxis. AMBULATORY SURGERY  DISCHARGE INSTRUCTIONS   1) The drugs that you were given will stay in your system until tomorrow so for the next 24 hours you should not:  A) Drive an automobile B) Make any legal decisions C) Drink any alcoholic beverage   2) You may resume regular meals tomorrow.  Today it is better to start with liquids and gradually work up to solid foods.  You may eat anything you prefer, but it is better to start with liquids, then soup and crackers, and gradually work up to solid foods.   3) Please notify your doctor immediately if you have any unusual bleeding, trouble breathing, redness and pain at the surgery site, drainage, fever, or pain not relieved by medication.   Follow up appointment with Dr. Martha Clan is on 06/23/14 at 4:00 pm at his office.  [phone]

## 2014-06-17 NOTE — Anesthesia Preprocedure Evaluation (Signed)
Anesthesia Evaluation  Patient identified by MRN, date of birth, ID band Patient awake    Reviewed: Allergy & Precautions, NPO status , Patient's Chart, lab work & pertinent test results  Airway Mallampati: III  TM Distance: <3 FB Neck ROM: Full    Dental  (+) Chipped   Pulmonary asthma ,    Pulmonary exam normal       Cardiovascular negative cardio ROS Normal cardiovascular exam    Neuro/Psych  Headaches, negative psych ROS   GI/Hepatic negative GI ROS, Neg liver ROS,   Endo/Other  negative endocrine ROS  Renal/GU negative Renal ROS  negative genitourinary   Musculoskeletal  (+) Arthritis -, Osteoarthritis,    Abdominal Normal abdominal exam  (+) + obese,   Peds negative pediatric ROS (+)  Hematology negative hematology ROS (+)   Anesthesia Other Findings   Reproductive/Obstetrics negative OB ROS                             Anesthesia Physical Anesthesia Plan  ASA: III  Anesthesia Plan: General   Post-op Pain Management:    Induction: Intravenous, Rapid sequence and Cricoid pressure planned  Airway Management Planned: Oral ETT  Additional Equipment:   Intra-op Plan:   Post-operative Plan: Extubation in OR  Informed Consent: I have reviewed the patients History and Physical, chart, labs and discussed the procedure including the risks, benefits and alternatives for the proposed anesthesia with the patient or authorized representative who has indicated his/her understanding and acceptance.   Dental advisory given  Plan Discussed with: CRNA and Surgeon  Anesthesia Plan Comments:         Anesthesia Quick Evaluation

## 2014-06-17 NOTE — Anesthesia Postprocedure Evaluation (Signed)
  Anesthesia Post-op Note  Patient: Carolyn Walton  Procedure(s) Performed: Procedure(s) with comments: KNEE ARTHROSCOPY WITH MEDIAL MENISECTOMY (Left) - partial medial menisectomy  Anesthesia type:General  Patient location: PACU  Post pain: Pain level controlled  Post assessment: Post-op Vital signs reviewed, Patient's Cardiovascular Status Stable, Respiratory Function Stable, Patent Airway and No signs of Nausea or vomiting  Post vital signs: Reviewed and stable  Last Vitals:  Filed Vitals:   06/17/14 1530  BP: 140/73  Pulse: 66  Temp:   Resp: 12    Level of consciousness: awake, alert  and patient cooperative  Complications: No apparent anesthesia complications

## 2014-06-18 ENCOUNTER — Encounter: Payer: Self-pay | Admitting: Orthopedic Surgery

## 2014-06-25 ENCOUNTER — Other Ambulatory Visit: Payer: Self-pay | Admitting: *Deleted

## 2014-06-25 MED ORDER — MELOXICAM 15 MG PO TABS: 15.0000 mg | ORAL_TABLET | Freq: Every day | ORAL | Status: DC

## 2014-06-25 NOTE — Telephone Encounter (Signed)
Refill request from pharmacy for meloxicam ok to fill

## 2014-11-09 ENCOUNTER — Other Ambulatory Visit: Payer: Self-pay

## 2014-11-09 MED ORDER — MONTELUKAST SODIUM 10 MG PO TABS: 10.0000 mg | ORAL_TABLET | Freq: Every day | ORAL | Status: DC

## 2014-11-21 ENCOUNTER — Other Ambulatory Visit: Payer: Self-pay

## 2014-11-21 MED ORDER — ALBUTEROL SULFATE HFA 108 (90 BASE) MCG/ACT IN AERS: 2.0000 | INHALATION_SPRAY | RESPIRATORY_TRACT | Status: DC | PRN

## 2014-11-25 ENCOUNTER — Encounter: Payer: Self-pay | Admitting: Family Medicine

## 2014-11-25 ENCOUNTER — Ambulatory Visit (INDEPENDENT_AMBULATORY_CARE_PROVIDER_SITE_OTHER): Payer: Commercial Managed Care - HMO | Admitting: Family Medicine

## 2014-11-25 DIAGNOSIS — H548 Legal blindness, as defined in USA: Secondary | ICD-10-CM | POA: Insufficient documentation

## 2014-11-25 DIAGNOSIS — R609 Edema, unspecified: Secondary | ICD-10-CM | POA: Insufficient documentation

## 2014-11-25 DIAGNOSIS — J309 Allergic rhinitis, unspecified: Secondary | ICD-10-CM | POA: Insufficient documentation

## 2014-11-25 DIAGNOSIS — J45909 Unspecified asthma, uncomplicated: Secondary | ICD-10-CM | POA: Diagnosis not present

## 2014-11-25 DIAGNOSIS — M1990 Unspecified osteoarthritis, unspecified site: Secondary | ICD-10-CM | POA: Insufficient documentation

## 2014-11-25 DIAGNOSIS — IMO0001 Reserved for inherently not codable concepts without codable children: Secondary | ICD-10-CM | POA: Insufficient documentation

## 2014-11-25 DIAGNOSIS — F329 Major depressive disorder, single episode, unspecified: Secondary | ICD-10-CM | POA: Insufficient documentation

## 2014-11-25 DIAGNOSIS — K279 Peptic ulcer, site unspecified, unspecified as acute or chronic, without hemorrhage or perforation: Secondary | ICD-10-CM | POA: Insufficient documentation

## 2014-11-25 DIAGNOSIS — S83209A Unspecified tear of unspecified meniscus, current injury, unspecified knee, initial encounter: Secondary | ICD-10-CM | POA: Insufficient documentation

## 2014-11-25 DIAGNOSIS — G43909 Migraine, unspecified, not intractable, without status migrainosus: Secondary | ICD-10-CM | POA: Insufficient documentation

## 2014-11-25 DIAGNOSIS — Z23 Encounter for immunization: Secondary | ICD-10-CM

## 2014-11-25 DIAGNOSIS — K219 Gastro-esophageal reflux disease without esophagitis: Secondary | ICD-10-CM | POA: Insufficient documentation

## 2014-11-25 DIAGNOSIS — E668 Other obesity: Secondary | ICD-10-CM | POA: Insufficient documentation

## 2014-11-25 DIAGNOSIS — F32A Depression, unspecified: Secondary | ICD-10-CM | POA: Insufficient documentation

## 2014-11-25 DIAGNOSIS — Z780 Asymptomatic menopausal state: Secondary | ICD-10-CM | POA: Insufficient documentation

## 2014-11-25 DIAGNOSIS — J0100 Acute maxillary sinusitis, unspecified: Secondary | ICD-10-CM | POA: Insufficient documentation

## 2014-11-25 DIAGNOSIS — R030 Elevated blood-pressure reading, without diagnosis of hypertension: Secondary | ICD-10-CM

## 2014-11-25 MED ORDER — FLUTICASONE-SALMETEROL 250-50 MCG/DOSE IN AEPB: 1.0000 | INHALATION_SPRAY | Freq: Every day | RESPIRATORY_TRACT | Status: DC

## 2014-11-25 MED ORDER — ALBUTEROL SULFATE HFA 108 (90 BASE) MCG/ACT IN AERS: 2.0000 | INHALATION_SPRAY | RESPIRATORY_TRACT | Status: DC | PRN

## 2014-11-25 NOTE — Progress Notes (Signed)
Patient ID: Carolyn Walton, female   DOB: [birth year], 54 y.o.   MRN: [id number]    Subjective:  HPI Pt is here today because she would like to discuss weight loss surgery. She is unsure of which surgery she would like, she has had friends and family that have had the sleeve and gastric bypass. Pt would like to discuss with Dr. Sullivan Lone which one he thinks is best for her. Asthma and migraines are stable at present. Prior to Admission medications   Medication Sig Start Date End Date Taking? Authorizing Provider  albuterol (VENTOLIN HFA) 108 (90 BASE) MCG/ACT inhaler Inhale 2 puffs into the lungs every 4 (four) hours as needed for wheezing or shortness of breath. 11/21/14  Yes Richard Hulen Shouts., MD  cetirizine (ZYRTEC) 10 MG tablet Take 10 mg by mouth daily.   Yes Historical Provider, MD  doxylamine, Sleep, (UNISOM) 25 MG tablet Take 25 mg by mouth at bedtime as needed.   Yes Historical Provider, MD  Fluticasone-Salmeterol (ADVAIR) 250-50 MCG/DOSE AEPB Inhale 1 puff into the lungs daily.   Yes Historical Provider, MD  furosemide (LASIX) 20 MG tablet Take by mouth. 11/10/13  Yes Historical Provider, MD  montelukast (SINGULAIR) 10 MG tablet Take 1 tablet (10 mg total) by mouth at bedtime. 11/09/14  Yes Richard Hulen Shouts., MD  SUMAtriptan (IMITREX) 100 MG tablet  01/29/13  Yes Historical Provider, MD  citalopram (CELEXA) 20 MG tablet  02/03/13   Historical Provider, MD  meloxicam (MOBIC) 15 MG tablet Take 1 tablet (15 mg total) by mouth daily. Patient not taking: Reported on 11/25/2014 06/25/14   Max Maud Deed, DPM    Patient Active Problem List   Diagnosis Date Noted  . Acute antritis 11/25/2014  . Allergic rhinitis 11/25/2014  . Arthritis 11/25/2014  . 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 Clinical depression 11/25/2014  . Accumulation of fluid in tissues 11/25/2014  . Blood pressure elevated 11/25/2014  . Esophageal reflux 11/25/2014  . Legal blindness, as defined in [place] 11/25/2014  . Headache, migraine 11/25/2014  . Extreme obesity (HCC) 11/25/2014  . Asymptomatic postmenopausal status 11/25/2014  . Peptic ulcer 11/25/2014  . Current tear of meniscus 11/25/2014    Past Medical History  Diagnosis Date  . Frequent headaches   . Allergy   . Asthma   . Arthritis     Social History   Social History  . Marital Status: Married    Spouse Name: N/A  . Number of Children: N/A  . Years of Education: N/A   Occupational History  . Not on file.   Social History Main Topics  . Smoking status: Never Smoker   . Smokeless tobacco: Never Used  . Alcohol Use: Yes     Comment: wine occasionally  . Drug Use: No  . Sexual Activity: Yes    Birth Control/ Protection: Post-menopausal, None   Other Topics Concern  . Not on file   Social History Narrative    Allergies  Allergen Reactions  . Molds & Smuts   . Other     CATS AND DOGS    Review of Systems  Constitutional: Negative.   HENT: Negative.   Eyes: Negative.   Respiratory: Negative.   Cardiovascular: Negative.   Gastrointestinal: Negative.   Genitourinary: Negative.   Musculoskeletal: Negative.   Skin: Negative.   Neurological: Negative.   Endo/Heme/Allergies: Negative.   Psychiatric/Behavioral: Negative.      There is no immunization history on file for  this patient. Objective:  BP 144/82 mmHg  Pulse 80  Temp(Src) 97.7 F (36.5 C) (Oral)  Resp 16  Ht 4\' 11"  (1.499 m)  Wt 292 lb (132.45 kg)  BMI 58.95 kg/m2  Physical Exam  Constitutional: She is oriented to person, place, and time and well-developed, well-nourished, and in no distress.  HENT:  Head: Normocephalic and atraumatic.  Right Ear: External ear normal.  Left Ear: External ear normal.  Nose: Nose normal.  Eyes: Conjunctivae and EOM are normal. Pupils are equal, round, and reactive to light.  Neck: Normal range of motion. Neck supple.  Cardiovascular: Normal rate, regular rhythm, normal heart sounds and  intact distal pulses.   Pulmonary/Chest: Effort normal and breath sounds normal.  Mild inspiratory wheezes  Abdominal: Soft. Bowel sounds are normal.  Musculoskeletal: Normal range of motion.  Neurological: She is alert and oriented to person, place, and time. She has normal reflexes. Gait normal. GCS score is 15.  Skin: Skin is warm and dry.  Psychiatric: Mood, memory, affect and judgment normal.    Lab Results  Component Value Date   WBC 7.2 06/15/2014   HGB 14.1 06/15/2014   HCT 43.4 06/15/2014   PLT 192 06/15/2014   GLUCOSE 94 06/15/2014   INR 0.96 06/15/2014    CMP     Component Value Date/Time   NA 139 06/15/2014 1515   NA 143 10/10/2013 1131   K 3.8 06/15/2014 1515   K 3.9 10/10/2013 1131   CL 105 06/15/2014 1515   CL 107 10/10/2013 1131   CO2 29 06/15/2014 1515   CO2 28 10/10/2013 1131   GLUCOSE 94 06/15/2014 1515   GLUCOSE 81 10/10/2013 1131   BUN 13 06/15/2014 1515   BUN 15 10/10/2013 1131   CREATININE 0.73 06/15/2014 1515   CREATININE 0.70 10/10/2013 1131   CALCIUM 8.9 06/15/2014 1515   CALCIUM 9.2 10/10/2013 1131   GFRNONAA >60 06/15/2014 1515   GFRNONAA >60 10/10/2013 1131   GFRAA >60 06/15/2014 1515   GFRAA >60 10/10/2013 1131    Assessment and Plan :  1. Morbid obesity, unspecified obesity type (HCC) - Ambulatory referral to General Surgery Dr Wenda LowMatt Martin More than 50% of visit spent in counselling with pt/husband. 2. Asthma, unspecified asthma severity, uncomplicated - Fluticasone-Salmeterol (ADVAIR) 250-50 MCG/DOSE AEPB; Inhale 1 puff into the lungs daily.  Dispense: 60 each; Refill: 12 - Pneumonia vaccine given  3. Need for influenza vaccination - Flu Vaccine QUAD 36+ mos IM 4.Migraines Patient was seen and examined by Dr. Julieanne Mansonichard Gilbert, and noted scribed by Dimas ChyleBrittany Byrd, CMA  Julieanne Mansonichard Gilbert MD [facility] 11/25/2014 8:16 AM

## 2015-03-20 ENCOUNTER — Encounter: Payer: Self-pay | Admitting: Family Medicine

## 2015-03-20 ENCOUNTER — Ambulatory Visit (INDEPENDENT_AMBULATORY_CARE_PROVIDER_SITE_OTHER): Payer: Commercial Managed Care - HMO | Admitting: Family Medicine

## 2015-03-20 VITALS — BP 158/88 | HR 88 | Temp 97.7°F | Resp 16

## 2015-03-20 DIAGNOSIS — J4520 Mild intermittent asthma, uncomplicated: Secondary | ICD-10-CM

## 2015-03-20 DIAGNOSIS — J0100 Acute maxillary sinusitis, unspecified: Secondary | ICD-10-CM

## 2015-03-20 DIAGNOSIS — J069 Acute upper respiratory infection, unspecified: Secondary | ICD-10-CM

## 2015-03-20 MED ORDER — AMOXICILLIN-POT CLAVULANATE 875-125 MG PO TABS: 1.0000 | ORAL_TABLET | Freq: Two times a day (BID) | ORAL | Status: DC

## 2015-03-20 MED ORDER — HYDROCOD POLST-CPM POLST ER 10-8 MG/5ML PO SUER: 5.0000 mL | Freq: Two times a day (BID) | ORAL | Status: DC | PRN

## 2015-03-20 NOTE — Progress Notes (Signed)
Patient ID: Carolyn Walton, female   DOB: 02-04-1960, 55 y.o.   MRN: [id number]        Patient: Carolyn Walton Female    DOB: 1960-04-19   55 y.o.   MRN: [id number] Visit Date: 03/20/2015  Today's Provider: Megan Mans, MD   Chief Complaint  Patient presents with  . URI  . Sinusitis   Subjective:    URI  This is a new problem. The current episode started in the past 7 days. The problem has been gradually worsening (Pt reports some improvement until last night she started to worsening againl.). The fever has been present for less than 1 day (Had a fever Tuesday). Associated symptoms include congestion, coughing, headaches, a plugged ear sensation, rhinorrhea, sinus pain, a sore throat, swollen glands and wheezing. Pertinent negatives include no chest pain, diarrhea, ear pain, joint swelling, nausea, neck pain, sneezing or vomiting. She has tried decongestant for the symptoms.  Sinusitis This is a new problem. The current episode started in the past 7 days. The problem has been gradually worsening since onset. Associated symptoms include congestion, coughing, headaches, sinus pressure, a sore throat and swollen glands. Pertinent negatives include no ear pain, neck pain or sneezing. Past treatments include oral decongestants.       Allergies  Allergen Reactions  . Molds & Smuts   . Other     CATS AND DOGS   Previous Medications   ALBUTEROL (VENTOLIN HFA) 108 (90 BASE) MCG/ACT INHALER    Inhale 2 puffs into the lungs every 4 (four) hours as needed for wheezing or shortness of breath.   CETIRIZINE (ZYRTEC) 10 MG TABLET    Take 10 mg by mouth daily.   CITALOPRAM (CELEXA) 20 MG TABLET       DOXYLAMINE, SLEEP, (UNISOM) 25 MG TABLET    Take 25 mg by mouth at bedtime as needed. Reported on 03/20/2015   FLUTICASONE-SALMETEROL (ADVAIR) 250-50 MCG/DOSE AEPB    Inhale 1 puff into the lungs daily.   FUROSEMIDE (LASIX) 20 MG TABLET    Take by mouth.   MELOXICAM (MOBIC) 15 MG TABLET     Take 1 tablet (15 mg total) by mouth daily.   MONTELUKAST (SINGULAIR) 10 MG TABLET    Take 1 tablet (10 mg total) by mouth at bedtime.   SUMATRIPTAN (IMITREX) 100 MG TABLET        Review of Systems  HENT: Positive for congestion, nosebleeds, postnasal drip, rhinorrhea, sinus pressure, sore throat and voice change. Negative for drooling, ear discharge, ear pain, sneezing, tinnitus and trouble swallowing.   Eyes: Positive for discharge. Negative for photophobia, pain, redness, itching and visual disturbance.  Respiratory: Positive for cough, chest tightness and wheezing. Negative for apnea, choking and stridor.   Cardiovascular: Negative.  Negative for chest pain.  Gastrointestinal: Negative.  Negative for nausea, vomiting and diarrhea.  Endocrine: Negative.   Musculoskeletal: Negative for neck pain.  Skin: Negative.   Allergic/Immunologic: Positive for environmental allergies.  Neurological: Positive for headaches. Negative for dizziness and light-headedness.  Hematological: Negative.   Psychiatric/Behavioral: Negative.     Social History  Substance Use Topics  . Smoking status: Never Smoker   . Smokeless tobacco: Never Used  . Alcohol Use: Yes     Comment: wine occasionally   Objective:   BP 158/88 mmHg  Pulse 88  Temp(Src) 97.7 F (36.5 C) (Oral)  Resp 16  SpO2 98%  Physical Exam  Constitutional: She appears well-developed and well-nourished.  Obese white  female, pear-shaped,  in no acute distress but obviously does not feel well.  HENT:  Head: Normocephalic and atraumatic.  Right Ear: External ear normal.  Left Ear: External ear normal.  Nose: Nose normal.  Mouth/Throat: Oropharynx is clear and moist.  Mild maxillary sinus tenderness  Eyes: Conjunctivae are normal.  Neck: Neck supple.  Cardiovascular: Normal rate, regular rhythm and normal heart sounds.   Pulmonary/Chest: Effort normal. No respiratory distress. She has wheezes.  Mild inspiratory next door he  wheezes.  Lymphadenopathy:    She has no cervical adenopathy.  Skin: Skin is warm and dry.  Psychiatric: She has a normal mood and affect. Her behavior is normal. Judgment and thought content normal.        Assessment & Plan:     Asthmatic bronchitis Sinusitis Both of these issues are secondary to the upper respiratory infection. At this time we'll treat with Augmentin 875 twice a day for 10 days. We'll treat the cough with Tussionex. Continue fluids and expectorants. Continue inhalers. No steroids at this time are necessary. I have done the exam and reviewed the above chart and it is accurate to the best of my knowledge.       Richard Wendelyn BreslowGilbert Jr, MD  [facility]

## 2015-03-22 ENCOUNTER — Other Ambulatory Visit: Payer: Self-pay | Admitting: Family Medicine

## 2015-03-22 MED ORDER — TOPIRAMATE 100 MG PO TABS: 100.0000 mg | ORAL_TABLET | Freq: Every day | ORAL | Status: DC

## 2015-03-22 NOTE — Telephone Encounter (Signed)
Done-aa 

## 2015-03-22 NOTE — Telephone Encounter (Signed)
Okay to refill for one year. 

## 2015-03-22 NOTE — Telephone Encounter (Signed)
Ok to refill? It's not on patient's med list. Thanks!

## 2015-03-22 NOTE — Telephone Encounter (Signed)
Please review-aa 

## 2015-03-22 NOTE — Telephone Encounter (Signed)
Pt contacted office for refill request on the following medications: topiramate (TOPAMAX) 100 MG tablet to [facility]. Pt stated she forgot to ask about the refill when she was here Saturday. Pt stated that [facility] advised her they had sent a refill request on 03/19/15. Thanks TNP

## 2015-03-25 ENCOUNTER — Other Ambulatory Visit: Payer: Self-pay

## 2015-03-25 MED ORDER — SUMATRIPTAN SUCCINATE 100 MG PO TABS: 100.0000 mg | ORAL_TABLET | Freq: Once | ORAL | Status: DC

## 2015-03-25 NOTE — Telephone Encounter (Signed)
[facility] Please review-aa

## 2015-03-29 ENCOUNTER — Telehealth: Payer: Self-pay

## 2015-03-29 DIAGNOSIS — Z1239 Encounter for other screening for malignant neoplasm of breast: Secondary | ICD-10-CM

## 2015-03-29 NOTE — Telephone Encounter (Signed)
lmtcb-need to get date of her last mammogram to update-aa

## 2015-03-29 NOTE — Telephone Encounter (Signed)
Patient has not had one since 2012-order put in and patient will call to make that appt-aa

## 2015-12-03 ENCOUNTER — Other Ambulatory Visit: Payer: Self-pay

## 2015-12-03 MED ORDER — MONTELUKAST SODIUM 10 MG PO TABS: 10.0000 mg | ORAL_TABLET | Freq: Every day | ORAL | 0 refills | Status: DC

## 2016-06-01 ENCOUNTER — Other Ambulatory Visit (HOSPITAL_COMMUNITY)
Admission: RE | Admit: 2016-06-01 | Discharge: 2016-06-01 | Disposition: A | Discharge status: Home / Self Care | Payer: Commercial Managed Care - HMO | Source: Ambulatory Visit | Attending: Obstetrics & Gynecology | Admitting: Obstetrics & Gynecology

## 2016-06-01 ENCOUNTER — Other Ambulatory Visit: Payer: Self-pay | Admitting: Obstetrics & Gynecology

## 2016-06-01 DIAGNOSIS — Z01411 Encounter for gynecological examination (general) (routine) with abnormal findings: Secondary | ICD-10-CM | POA: Insufficient documentation

## 2016-06-01 DIAGNOSIS — R102 Pelvic and perineal pain: Secondary | ICD-10-CM | POA: Diagnosis not present

## 2016-06-06 LAB — CYTOLOGY - PAP
Diagnosis: NEGATIVE
HPV: NOT DETECTED

## 2016-06-08 DIAGNOSIS — R102 Pelvic and perineal pain: Secondary | ICD-10-CM | POA: Diagnosis not present

## 2016-06-21 ENCOUNTER — Other Ambulatory Visit: Payer: Self-pay | Admitting: Family Medicine

## 2016-06-21 MED ORDER — MONTELUKAST SODIUM 10 MG PO TABS: 10.0000 mg | ORAL_TABLET | Freq: Every day | ORAL | 3 refills | Status: DC

## 2016-06-21 NOTE — Telephone Encounter (Signed)
[facility] faxed a request for the following medication. Thanks CC  montelukast (SINGULAIR) 10 MG tablet  >Take one tablet by mouth every night at bedtime. Qty: 90

## 2016-06-21 NOTE — Telephone Encounter (Signed)
Done-aa 

## 2016-06-23 ENCOUNTER — Other Ambulatory Visit: Payer: Self-pay

## 2016-06-23 MED ORDER — MONTELUKAST SODIUM 10 MG PO TABS: 10.0000 mg | ORAL_TABLET | Freq: Every day | ORAL | 3 refills | Status: DC

## 2016-06-26 ENCOUNTER — Ambulatory Visit: Payer: Commercial Managed Care - HMO | Admitting: Family Medicine

## 2016-06-27 ENCOUNTER — Encounter: Payer: Self-pay | Admitting: Family Medicine

## 2016-06-27 ENCOUNTER — Ambulatory Visit (INDEPENDENT_AMBULATORY_CARE_PROVIDER_SITE_OTHER): Payer: 59 | Admitting: Family Medicine

## 2016-06-27 VITALS — BP 126/76 | HR 82 | Temp 97.7°F | Resp 16 | Wt 273.0 lb

## 2016-06-27 DIAGNOSIS — G43909 Migraine, unspecified, not intractable, without status migrainosus: Secondary | ICD-10-CM

## 2016-06-27 DIAGNOSIS — J4520 Mild intermittent asthma, uncomplicated: Secondary | ICD-10-CM

## 2016-06-27 DIAGNOSIS — J309 Allergic rhinitis, unspecified: Secondary | ICD-10-CM

## 2016-06-27 MED ORDER — MONTELUKAST SODIUM 10 MG PO TABS: 10.0000 mg | ORAL_TABLET | Freq: Every day | ORAL | 3 refills | Status: DC

## 2016-06-27 MED ORDER — FLUTICASONE-SALMETEROL 250-50 MCG/DOSE IN AEPB: 1.0000 | INHALATION_SPRAY | Freq: Every day | RESPIRATORY_TRACT | 3 refills | Status: DC

## 2016-06-27 MED ORDER — TOPIRAMATE 100 MG PO TABS: 100.0000 mg | ORAL_TABLET | Freq: Every day | ORAL | 3 refills | Status: DC

## 2016-06-27 NOTE — Progress Notes (Signed)
Subjective:  HPI   Pt is here today for a follow up. She is needing refills on all her medications.   Asthma: She reports that mornings have been worse since being out of medication, but no flares.   Migraines: She is taking Topamax and migraines has been better. She reports that she has stopped all gluten. She started this in October. She says she feels a lot better and seems that her migraines are better due to this. She has also lost 19 pounds since she as in last (11/2015). " Its amazing how much better I feel since I stopped eating gluten". She also stopped sugar. She exercising some, walking up stairs.   Prior to Admission medications   Medication Sig Start Date End Date Taking? Authorizing Provider  albuterol (VENTOLIN HFA) 108 (90 BASE) MCG/ACT inhaler Inhale 2 puffs into the lungs every 4 (four) hours as needed for wheezing or shortness of breath. 11/25/14   Maple Hudson., MD  amoxicillin-clavulanate (AUGMENTIN) 875-125 MG tablet Take 1 tablet by mouth 2 (two) times daily. 03/20/15   Maple Hudson., MD  cetirizine (ZYRTEC) 10 MG tablet Take 10 mg by mouth daily.    [provider]  chlorpheniramine-HYDROcodone (TUSSIONEX PENNKINETIC ER) 10-8 MG/5ML SUER Take 5 mLs by mouth every 12 (twelve) hours as needed for cough. 03/20/15   Maple Hudson., MD  citalopram (CELEXA) 20 MG tablet  02/03/13   [provider]  doxylamine, Sleep, (UNISOM) 25 MG tablet Take 25 mg by mouth at bedtime as needed. Reported on 03/20/2015    [provider]  Fluticasone-Salmeterol (ADVAIR) 250-50 MCG/DOSE AEPB Inhale 1 puff into the lungs daily. 11/25/14   Maple Hudson., MD  furosemide (LASIX) 20 MG tablet Take by mouth. 11/10/13   [provider]  meloxicam (MOBIC) 15 MG tablet Take 1 tablet (15 mg total) by mouth daily. Patient not taking: Reported on 11/25/2014 06/25/14   Hyatt, Max T, DPM  montelukast (SINGULAIR) 10 MG tablet Take 1 tablet  (10 mg total) by mouth at bedtime. 06/23/16   Maple Hudson., MD  SUMAtriptan (IMITREX) 100 MG tablet Take 1 tablet (100 mg total) by mouth once. 03/25/15   Maple Hudson., MD  topiramate (TOPAMAX) 100 MG tablet Take 1 tablet (100 mg total) by mouth daily. 03/22/15   Maple Hudson., MD    Patient Active Problem List   Diagnosis Date Noted  . Acute antritis 11/25/2014  . Allergic rhinitis 11/25/2014  . Arthritis 11/25/2014  . 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 Clinical depression 11/25/2014  . Accumulation of fluid in tissues 11/25/2014  . Blood pressure elevated 11/25/2014  . Esophageal reflux 11/25/2014  . Legal blindness, as defined in [place] 11/25/2014  . Headache, migraine 11/25/2014  . Extreme obesity 11/25/2014  . Asymptomatic postmenopausal status 11/25/2014  . Peptic ulcer 11/25/2014  . Current tear of meniscus 11/25/2014    Past Medical History:  Diagnosis Date  . Allergy   . Arthritis   . Asthma   . Frequent headaches     Social History   Social History  . Marital status: Married    Spouse name: N/A  . Number of children: N/A  . Years of education: N/A   Occupational History  . Not on file.   Social History Main Topics  . Smoking status: Never Smoker  . Smokeless tobacco: Never Used  . Alcohol use Yes  Comment: wine occasionally  . Drug use: No  . Sexual activity: Yes    Birth control/ protection: Post-menopausal, None   Other Topics Concern  . Not on file   Social History Narrative  . No narrative on file    Allergies  Allergen Reactions  . Molds & Smuts   . Other     CATS AND DOGS    Review of Systems  Constitutional: Negative.   HENT: Negative.   Eyes: Negative.   Respiratory: Positive for wheezing.   Cardiovascular: Negative.   Gastrointestinal: Negative.   Genitourinary: Negative.   Musculoskeletal: Negative.   Skin: Negative.   Neurological: Positive for headaches.    Endo/Heme/Allergies: Negative.   Psychiatric/Behavioral: Negative.     Immunization History  Administered Date(s) Administered  . Influenza,inj,Quad PF,36+ Mos 11/25/2014  . Pneumococcal Conjugate-13 11/25/2014  . Pneumococcal Polysaccharide-23 09/29/2008  . Tdap 03/08/2010, 11/10/2013    Objective:  BP 126/76 (BP Location: Left Arm, Patient Position: Sitting, Cuff Size: Large)   Pulse 82   Temp 97.7 F (36.5 C) (Oral)   Resp 16   Wt 273 lb (123.8 kg)   SpO2 97%   BMI 55.14 kg/m   Physical Exam  Constitutional: She is oriented to person, place, and time and well-developed, well-nourished, and in no distress.  Eyes: Conjunctivae and EOM are normal. Pupils are equal, round, and reactive to light.  Neck: Normal range of motion. Neck supple.  Cardiovascular: Normal rate, regular rhythm, normal heart sounds and intact distal pulses.   Pulmonary/Chest: Effort normal and breath sounds normal.  Musculoskeletal: Normal range of motion.  Neurological: She is alert and oriented to person, place, and time. She has normal reflexes. Gait normal. GCS score is 15.  Skin: Skin is warm and dry.  Psychiatric: Mood, memory, affect and judgment normal.    Lab Results  Component Value Date   WBC 7.2 06/15/2014   HGB 14.1 06/15/2014   HCT 43.4 06/15/2014   PLT 192 06/15/2014   GLUCOSE 94 06/15/2014   INR 0.96 06/15/2014    CMP     Component Value Date/Time   NA 139 06/15/2014 1515   NA 143 10/10/2013 1131   K 3.8 06/15/2014 1515   K 3.9 10/10/2013 1131   CL 105 06/15/2014 1515   CL 107 10/10/2013 1131   CO2 29 06/15/2014 1515   CO2 28 10/10/2013 1131   GLUCOSE 94 06/15/2014 1515   GLUCOSE 81 10/10/2013 1131   BUN 13 06/15/2014 1515   BUN 15 10/10/2013 1131   CREATININE 0.73 06/15/2014 1515   CREATININE 0.70 10/10/2013 1131   CALCIUM 8.9 06/15/2014 1515   CALCIUM 9.2 10/10/2013 1131   GFRNONAA >60 06/15/2014 1515   GFRNONAA >60 10/10/2013 1131   GFRAA >60 06/15/2014 1515    GFRAA >60 10/10/2013 1131    Assessment and Plan :  1. Migraine without status migrainosus, not intractable, unspecified migraine type Stable. Medications pulled down for refill purposes only.  - topiramate (TOPAMAX) 100 MG tablet; Take 1 tablet (100 mg total) by mouth daily.  Dispense: 90 tablet; Refill: 3  2. Allergic rhinitis, unspecified seasonality, unspecified trigger   3. Mild intermittent asthma without complication  - montelukast (SINGULAIR) 10 MG tablet; Take 1 tablet (10 mg total) by mouth at bedtime.  Dispense: 90 tablet; Refill: 3 - Fluticasone-Salmeterol (ADVAIR) 250-50 MCG/DOSE AEPB; Inhale 1 puff into the lungs daily.  Dispense: 180 each; Refill: 3  4. Morbid obesity, unspecified obesity type (HCC) Pt has lost  19 pounds since November. She is doing Gluten free. Encouraged to keep up the good work.    HPI, Exam, and A&P Transcribed under the direction and in the presence of Richard L. Wendelyn Breslow, MD  Electronically Signed: Silvio Pate, CMA I have done the exam and reviewed the above chart and it is accurate to the best of my knowledge. Dentist has been used in this note in any air is in the dictation or transcription are unintentional.  Julieanne Manson MD [facility] 06/27/2016 8:26 AM

## 2016-07-31 ENCOUNTER — Other Ambulatory Visit: Payer: Self-pay | Admitting: Family Medicine

## 2016-07-31 NOTE — Telephone Encounter (Signed)
[facility] faxed a request on the following medication.  Thanks CC  SUMAtriptan (IMITREX) 100 MG tablet  >Take one (1) tablet by mouth once.

## 2016-08-01 MED ORDER — SUMATRIPTAN SUCCINATE 100 MG PO TABS: 100.0000 mg | ORAL_TABLET | Freq: Once | ORAL | 12 refills | Status: DC

## 2016-08-01 NOTE — Telephone Encounter (Signed)
Faxed Rx for SUMAtriptan (IMITREX) 100 MG tablet to [facility]. Thanks TNP

## 2016-08-19 ENCOUNTER — Encounter: Payer: Self-pay | Admitting: Family Medicine

## 2016-08-19 ENCOUNTER — Ambulatory Visit (INDEPENDENT_AMBULATORY_CARE_PROVIDER_SITE_OTHER): Payer: 59 | Admitting: Family Medicine

## 2016-08-19 VITALS — BP 106/82 | HR 85 | Temp 98.3°F | Resp 16 | Wt 267.8 lb

## 2016-08-19 DIAGNOSIS — J0110 Acute frontal sinusitis, unspecified: Secondary | ICD-10-CM

## 2016-08-19 DIAGNOSIS — J4520 Mild intermittent asthma, uncomplicated: Secondary | ICD-10-CM | POA: Diagnosis not present

## 2016-08-19 MED ORDER — PREDNISONE 10 MG PO TABS: 10.0000 mg | ORAL_TABLET | Freq: Every day | ORAL | 0 refills | Status: DC

## 2016-08-19 MED ORDER — AZITHROMYCIN 250 MG PO TABS: ORAL_TABLET | ORAL | 0 refills | Status: DC

## 2016-08-19 NOTE — Progress Notes (Signed)
Patient: Carolyn Walton Female    DOB: [birth year]   56 y.o.   MRN: [id number] Visit Date: 08/19/2016  Today's Provider: Megan Mans, MD   Chief Complaint  Patient presents with  . Cough   Subjective:    HPI   Patient comes in office today with complaints of cough and congestion upon awakening this morning, she denies fever or taking any otc medication this morning.   Allergies  Allergen Reactions  . Molds & Smuts   . Other     CATS AND DOGS     Current Outpatient Prescriptions:  .  albuterol (VENTOLIN HFA) 108 (90 BASE) MCG/ACT inhaler, Inhale 2 puffs into the lungs every 4 (four) hours as needed for wheezing or shortness of breath., Disp: 18 g, Rfl: 12 .  cetirizine (ZYRTEC) 10 MG tablet, Take 10 mg by mouth daily., Disp: , Rfl:  .  citalopram (CELEXA) 20 MG tablet, , Disp: , Rfl:  .  conjugated estrogens (PREMARIN) vaginal cream, Place 1 Applicatorful vaginally daily., Disp: , Rfl:  .  doxylamine, Sleep, (UNISOM) 25 MG tablet, Take 25 mg by mouth at bedtime as needed. Reported on 03/20/2015, Disp: , Rfl:  .  Fluticasone-Salmeterol (ADVAIR) 250-50 MCG/DOSE AEPB, Inhale 1 puff into the lungs daily., Disp: 180 each, Rfl: 3 .  montelukast (SINGULAIR) 10 MG tablet, Take 1 tablet (10 mg total) by mouth at bedtime., Disp: 90 tablet, Rfl: 3 .  topiramate (TOPAMAX) 100 MG tablet, Take 1 tablet (100 mg total) by mouth daily., Disp: 90 tablet, Rfl: 3 .  SUMAtriptan (IMITREX) 100 MG tablet, Take 1 tablet (100 mg total) by mouth once., Disp: 10 tablet, Rfl: 12  Review of Systems  Constitutional: Positive for chills and fatigue. Negative for activity change, appetite change, diaphoresis and fever.  HENT: Positive for congestion, facial swelling (around nasal cavity), postnasal drip, rhinorrhea, sinus pain, sinus pressure, sneezing and sore throat. Negative for dental problem, drooling, ear discharge, ear pain, hearing loss, mouth sores, nosebleeds, tinnitus, trouble swallowing  and voice change.   Eyes: Positive for redness (watery eyes). Negative for photophobia, pain, discharge, itching and visual disturbance.  Respiratory: Positive for cough. Negative for apnea, choking, chest tightness, shortness of breath, wheezing and stridor.   Cardiovascular: Negative.   Gastrointestinal: Negative.   Endocrine: Negative.   Genitourinary: Negative.   Musculoskeletal: Negative.   Skin: Negative.   Allergic/Immunologic: Negative.   Neurological: Negative.   Hematological: Negative.   Psychiatric/Behavioral: Negative.     Social History  Substance Use Topics  . Smoking status: Never Smoker  . Smokeless tobacco: Never Used  . Alcohol use Yes     Comment: wine occasionally   Objective:   BP 106/82   Pulse 85   Temp 98.3 F (36.8 C) (Oral)   Resp 16   Wt 267 lb 12.8 oz (121.5 kg)   SpO2 97%   BMI 54.09 kg/m  Vitals:   08/19/16 0911  BP: 106/82  Pulse: 85  Resp: 16  Temp: 98.3 F (36.8 C)  TempSrc: Oral  SpO2: 97%  Weight: 267 lb 12.8 oz (121.5 kg)     Physical Exam  Constitutional: She is oriented to person, place, and time. She appears well-developed and well-nourished.  HENT:  Head: Normocephalic and atraumatic.  Right Ear: External ear normal.  Left Ear: External ear normal.  Nose: Nose normal.  Mouth/Throat: Oropharynx is clear and moist.  Eyes: Conjunctivae are normal.  Neck: No thyromegaly present.  Cardiovascular: Normal rate, regular rhythm and normal heart sounds.   Pulmonary/Chest: Effort normal and breath sounds normal.  Mild insp/exp wheezes.  Abdominal: Soft.  Lymphadenopathy:    She has no cervical adenopathy.  Neurological: She is alert and oriented to person, place, and time.  Skin: Skin is warm and dry.  Psychiatric: She has a normal mood and affect. Her behavior is normal. Judgment and thought content normal.        Assessment & Plan:     Asthmatic Bronchitis Zpak/6 day pred taper      Megan Mans, MD    [facility]

## 2016-11-13 ENCOUNTER — Encounter: Payer: Self-pay | Admitting: Family Medicine

## 2017-01-31 ENCOUNTER — Ambulatory Visit: Payer: 59 | Admitting: Family Medicine

## 2017-01-31 VITALS — BP 144/78 | HR 70 | Temp 97.7°F | Resp 18 | Wt 275.0 lb

## 2017-01-31 DIAGNOSIS — J4521 Mild intermittent asthma with (acute) exacerbation: Secondary | ICD-10-CM

## 2017-01-31 DIAGNOSIS — J45909 Unspecified asthma, uncomplicated: Secondary | ICD-10-CM

## 2017-01-31 DIAGNOSIS — R52 Pain, unspecified: Secondary | ICD-10-CM

## 2017-01-31 LAB — POCT INFLUENZA A/B
INFLUENZA A, POC: NEGATIVE
INFLUENZA B, POC: NEGATIVE

## 2017-01-31 MED ORDER — PREDNISONE 10 MG (21) PO TBPK: ORAL_TABLET | ORAL | 0 refills | Status: DC

## 2017-01-31 MED ORDER — ALBUTEROL SULFATE HFA 108 (90 BASE) MCG/ACT IN AERS: 2.0000 | INHALATION_SPRAY | RESPIRATORY_TRACT | 12 refills | Status: DC | PRN

## 2017-01-31 MED ORDER — FLUTICASONE-SALMETEROL 250-50 MCG/DOSE IN AEPB
1.0000 | INHALATION_SPRAY | Freq: Every day | RESPIRATORY_TRACT | 3 refills | Status: DC
Start: 2017-01-31 — End: 2018-11-05

## 2017-01-31 MED ORDER — AZITHROMYCIN 250 MG PO TABS: ORAL_TABLET | ORAL | 0 refills | Status: DC

## 2017-01-31 NOTE — Progress Notes (Signed)
Patient: Carolyn Walton Female    DOB: Oct 19, 1960   57 y.o.   MRN: [id number] Visit Date: 01/31/2017  Today's Provider: Megan Mansichard Merial Moritz Jr, MD   Chief Complaint  Patient presents with  . URI   Subjective:    HPI Pt reports that Monday afternoon all of a sudden. She started having a runny nose then by 9:00 pm she was in bed. She had chills all that day and body aches worse at night. She does not think she has had a fever but it is hard to tell with the dayquil and nyquil that she has been taking. Pt reports that she has started coughing and wheezing, she has nausea and diarrhea. Her sputum and mucus are clear. Pt did not have the flu vaccine.       Allergies  Allergen Reactions  . Molds & Smuts   . Other     CATS AND DOGS     Current Outpatient Medications:  .  albuterol (VENTOLIN HFA) 108 (90 BASE) MCG/ACT inhaler, Inhale 2 puffs into the lungs every 4 (four) hours as needed for wheezing or shortness of breath., Disp: 18 g, Rfl: 12 .  azithromycin (ZITHROMAX Z-PAK) 250 MG tablet, Take 2 tablets the first day than 1 tablet every day after till completed, Disp: 6 each, Rfl: 0 .  cetirizine (ZYRTEC) 10 MG tablet, Take 10 mg by mouth daily., Disp: , Rfl:  .  citalopram (CELEXA) 20 MG tablet, , Disp: , Rfl:  .  conjugated estrogens (PREMARIN) vaginal cream, Place 1 Applicatorful vaginally daily., Disp: , Rfl:  .  doxylamine, Sleep, (UNISOM) 25 MG tablet, Take 25 mg by mouth at bedtime as needed. Reported on 03/20/2015, Disp: , Rfl:  .  Fluticasone-Salmeterol (ADVAIR) 250-50 MCG/DOSE AEPB, Inhale 1 puff into the lungs daily., Disp: 180 each, Rfl: 3 .  montelukast (SINGULAIR) 10 MG tablet, Take 1 tablet (10 mg total) by mouth at bedtime., Disp: 90 tablet, Rfl: 3 .  predniSONE (DELTASONE) 10 MG tablet, Take 1 tablet (10 mg total) by mouth daily with breakfast., Disp: 21 tablet, Rfl: 0 .  SUMAtriptan (IMITREX) 100 MG tablet, Take 1 tablet (100 mg total) by mouth once., Disp: 10  tablet, Rfl: 12 .  topiramate (TOPAMAX) 100 MG tablet, Take 1 tablet (100 mg total) by mouth daily., Disp: 90 tablet, Rfl: 3  Review of Systems  Constitutional: Positive for chills and fatigue.  HENT: Positive for congestion, postnasal drip, rhinorrhea, sinus pressure, sinus pain, sneezing and sore throat.   Eyes: Negative.   Respiratory: Positive for cough, shortness of breath and wheezing.   Cardiovascular: Negative.   Gastrointestinal: Negative.   Endocrine: Negative.   Genitourinary: Negative.   Musculoskeletal: Positive for myalgias.  Skin: Negative.   Allergic/Immunologic: Negative.   Neurological: Positive for headaches.  Hematological: Negative.   Psychiatric/Behavioral: Negative.     Social History   Tobacco Use  . Smoking status: Never Smoker  . Smokeless tobacco: Never Used  Substance Use Topics  . Alcohol use: Yes    Comment: wine occasionally   Objective:   There were no vitals taken for this visit. There were no vitals filed for this visit.   Physical Exam  Constitutional: She is oriented to person, place, and time. She appears well-developed and well-nourished.  HENT:  Head: Normocephalic and atraumatic.  Eyes: Conjunctivae are normal. No scleral icterus.  Neck: Normal range of motion. Neck supple. No thyromegaly present.  Cardiovascular: Normal rate, regular  rhythm and normal heart sounds.  Pulmonary/Chest: Effort normal. She has wheezes (inspirtory wheeze right lower lobe).  Abdominal: Soft.  Musculoskeletal: Normal range of motion.  Neurological: She is alert and oriented to person, place, and time. She has normal reflexes.  Skin: Skin is warm and dry.  Psychiatric: She has a normal mood and affect. Her behavior is normal. Judgment and thought content normal.        Assessment & Plan:     1. Mild intermittent asthma without complication  - Fluticasone-Salmeterol (ADVAIR) 250-50 MCG/DOSE AEPB; Inhale 1 puff into the lungs daily.  Dispense: 180  each; Refill: 3  2. Body aches  - POCT Influenza A/B--negative.  3. Uncomplicated asthma  - albuterol (VENTOLIN HFA) 108 (90 Base) MCG/ACT inhaler; Inhale 2 puffs into the lungs every 4 (four) hours as needed for wheezing or shortness of breath.  Dispense: 18 g; Refill: 12  4. Asthmatic bronchitis without complication, unspecified asthma severity, unspecified whether persistent  - azithromycin (ZITHROMAX Z-PAK) 250 MG tablet; Take 2 tablets the first day than 1 tablet every day after till completed  Dispense: 6 each; Refill: 0 - predniSONE (STERAPRED UNI-PAK 21 TAB) 10 MG (21) TBPK tablet; Take 6 tablets on the first day then decrease by one daily until finished.  Dispense: 21 tablet; Refill: 0  I have done the exam and reviewed the chart and it is accurate to the best of my knowledge. Dentist has been used and  any errors in dictation or transcription are unintentional. Julieanne Manson M.D. [facility]       Megan Mans, MD  [facility]

## 2017-06-13 ENCOUNTER — Other Ambulatory Visit: Payer: Self-pay

## 2017-06-13 ENCOUNTER — Emergency Department (HOSPITAL_COMMUNITY)
Admission: EM | Admit: 2017-06-13 | Discharge: 2017-06-13 | Disposition: A | Discharge status: Home / Self Care | Payer: 59 | Attending: Emergency Medicine | Admitting: Emergency Medicine

## 2017-06-13 ENCOUNTER — Emergency Department (HOSPITAL_COMMUNITY): Payer: 59

## 2017-06-13 ENCOUNTER — Encounter (HOSPITAL_COMMUNITY): Payer: Self-pay | Admitting: Emergency Medicine

## 2017-06-13 DIAGNOSIS — J45909 Unspecified asthma, uncomplicated: Secondary | ICD-10-CM | POA: Diagnosis not present

## 2017-06-13 DIAGNOSIS — R0789 Other chest pain: Secondary | ICD-10-CM

## 2017-06-13 DIAGNOSIS — Z79899 Other long term (current) drug therapy: Secondary | ICD-10-CM | POA: Insufficient documentation

## 2017-06-13 DIAGNOSIS — R079 Chest pain, unspecified: Secondary | ICD-10-CM | POA: Diagnosis not present

## 2017-06-13 LAB — BASIC METABOLIC PANEL
Anion gap: 8 (ref 5–15)
BUN: 13 mg/dL (ref 6–20)
CO2: 23 mmol/L (ref 22–32)
CREATININE: 0.67 mg/dL (ref 0.44–1.00)
Calcium: 9.2 mg/dL (ref 8.9–10.3)
Chloride: 110 mmol/L (ref 101–111)
Glucose, Bld: 87 mg/dL (ref 65–99)
Potassium: 4.3 mmol/L (ref 3.5–5.1)
SODIUM: 141 mmol/L (ref 135–145)

## 2017-06-13 LAB — I-STAT BETA HCG BLOOD, ED (MC, WL, AP ONLY)

## 2017-06-13 LAB — I-STAT TROPONIN, ED
TROPONIN I, POC: 0 ng/mL (ref 0.00–0.08)
Troponin i, poc: 0 ng/mL (ref 0.00–0.08)

## 2017-06-13 LAB — CBC
HCT: 46.6 % — ABNORMAL HIGH (ref 36.0–46.0)
Hemoglobin: 14.7 g/dL (ref 12.0–15.0)
MCH: 28.3 pg (ref 26.0–34.0)
MCHC: 31.5 g/dL (ref 30.0–36.0)
MCV: 89.8 fL (ref 78.0–100.0)
PLATELETS: 188 10*3/uL (ref 150–400)
RBC: 5.19 MIL/uL — ABNORMAL HIGH (ref 3.87–5.11)
RDW: 13.1 % (ref 11.5–15.5)
WBC: 6.2 10*3/uL (ref 4.0–10.5)

## 2017-06-13 NOTE — ED Notes (Signed)
Pt stable, ambulatory, and verbalizes understanding of d/c instructions.  

## 2017-06-13 NOTE — ED Provider Notes (Signed)
[facility] EMERGENCY DEPARTMENT Provider Note   CSN: [id number] Arrival date & time: 06/13/17  1142     History   Chief Complaint Chief Complaint  Patient presents with  . Chest Pain  . Jaw Pain  . Arm Pain    HPI Carolyn Walton is a 57 y.o. female.  57 year old female with prior history of asthma, GERD, obesity, and hypertension presents with complaint of chest pain.  Patient reports that this morning around 1030 while she was at work she developed substernal chest pressure.  This was associated with diaphoresis.  She denies associated shortness of breath or nausea.  She reports that the pain radiated to her right shoulder.  Symptoms lasted approximately 10 minutes.  She now feels improved and is without chest pain.  Denies prior history of chest pain or cardiac disease.  She denies prior work-up for CAD.  The history is provided by the patient.  Chest Pain   This is a new problem. The current episode started 6 to 12 hours ago. The problem occurs rarely. The problem has been resolved. The pain is associated with rest. The pain is present in the substernal region. The pain is mild. The quality of the pain is described as pressure-like. The pain radiates to the right shoulder. Associated symptoms include diaphoresis. Pertinent negatives include no abdominal pain, no back pain and no nausea. She has tried nothing for the symptoms. The treatment provided no relief. Risk factors include obesity.  Arm Pain  Associated symptoms include chest pain. Pertinent negatives include no abdominal pain.    Past Medical History:  Diagnosis Date  . Allergy   . Arthritis   . Asthma   . Frequent headaches     Patient Active Problem List   Diagnosis Date Noted  . Acute antritis 11/25/2014  . Allergic rhinitis 11/25/2014  . Arthritis 11/25/2014  . 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 Clinical depression 11/25/2014  . Accumulation of fluid in tissues 11/25/2014  . Blood  pressure elevated 11/25/2014  . Esophageal reflux 11/25/2014  . Legal blindness, as defined in [place] 11/25/2014  . Headache, migraine 11/25/2014  . Extreme obesity 11/25/2014  . Asymptomatic postmenopausal status 11/25/2014  . Peptic ulcer 11/25/2014  . Current tear of meniscus 11/25/2014    Past Surgical History:  Procedure Laterality Date  . CESAREAN SECTION     times 2  . KNEE ARTHROSCOPY WITH MEDIAL MENISECTOMY Left 06/17/2014   Procedure: KNEE ARTHROSCOPY WITH MEDIAL MENISECTOMY;  Surgeon: Juanell Fairly, MD;  Location: [facility] ORS;  Service: Orthopedics;  Laterality: Left;  partial medial menisectomy     OB History   None      Home Medications    Prior to Admission medications   Medication Sig Start Date End Date Taking? Authorizing Provider  albuterol (VENTOLIN HFA) 108 (90 Base) MCG/ACT inhaler Inhale 2 puffs into the lungs every 4 (four) hours as needed for wheezing or shortness of breath. 01/31/17  Yes Maple Hudson., MD  cetirizine (ZYRTEC) 10 MG tablet Take 10 mg by mouth daily.   Yes [provider]  doxylamine, Sleep, (UNISOM) 25 MG tablet Take 25 mg by mouth at bedtime as needed. Reported on 03/20/2015   Yes [provider]  Fluticasone-Salmeterol (ADVAIR) 250-50 MCG/DOSE AEPB Inhale 1 puff into the lungs daily. 01/31/17  Yes Maple Hudson., MD  montelukast (SINGULAIR) 10 MG tablet Take 1 tablet (10 mg total) by mouth at bedtime. 06/27/16  Yes  Maple HudsonGilbert, Richard L Jr., MD  SUMAtriptan (IMITREX) 100 MG tablet Take 1 tablet (100 mg total) by mouth once. 08/01/16 06/13/17 Yes Maple HudsonGilbert, Richard L Jr., MD  topiramate (TOPAMAX) 100 MG tablet Take 1 tablet (100 mg total) by mouth daily. 06/27/16  Yes Maple HudsonGilbert, Richard L Jr., MD  azithromycin (ZITHROMAX Z-PAK) 250 MG tablet Take 2 tablets the first day than 1 tablet every day after till completed Patient not taking: Reported on 06/13/2017 01/31/17   Maple HudsonGilbert, Richard L Jr., MD    citalopram (CELEXA) 20 MG tablet  02/03/13   [provider]  predniSONE (DELTASONE) 10 MG tablet Take 1 tablet (10 mg total) by mouth daily with breakfast. Patient not taking: Reported on 01/31/2017 08/19/16   Maple HudsonGilbert, Richard L Jr., MD  predniSONE (STERAPRED UNI-PAK 21 TAB) 10 MG (21) TBPK tablet Take 6 tablets on the first day then decrease by one daily until finished. Patient not taking: Reported on 06/13/2017 01/31/17   Maple HudsonGilbert, Richard L Jr., MD    Family History No family history on file.  Social History Social History   Tobacco Use  . Smoking status: Never Smoker  . Smokeless tobacco: Never Used  Substance Use Topics  . Alcohol use: Yes    Comment: wine occasionally  . Drug use: No     Allergies   Molds & smuts and Other   Review of Systems Review of Systems  Constitutional: Positive for diaphoresis.  Cardiovascular: Positive for chest pain.  Gastrointestinal: Negative for abdominal pain and nausea.  Musculoskeletal: Negative for back pain.  All other systems reviewed and are negative.    Physical Exam Updated Vital Signs BP (!) 144/67 (BP Location: Right Arm)   Pulse 66   Temp (!) 97.5 F (36.4 C) (Oral)   Resp 18   Ht 4\' 11"  (1.499 m)   Wt 123.4 kg (272 lb)   SpO2 100%   BMI 54.94 kg/m   Physical Exam  Constitutional: She is oriented to person, place, and time. She appears well-developed and well-nourished. No distress.  HENT:  Head: Normocephalic and atraumatic.  Mouth/Throat: Oropharynx is clear and moist.  Eyes: Pupils are equal, round, and reactive to light. Conjunctivae and EOM are normal.  Neck: Normal range of motion. Neck supple.  Cardiovascular: Normal rate, regular rhythm and normal heart sounds.  Pulmonary/Chest: Effort normal and breath sounds normal. No respiratory distress.  Abdominal: Soft. She exhibits no distension. There is no tenderness.  Musculoskeletal: Normal range of motion. She exhibits no edema or deformity.   Neurological: She is alert and oriented to person, place, and time.  Skin: Skin is warm and dry.  Psychiatric: She has a normal mood and affect.  Nursing note and vitals reviewed.    ED Treatments / Results  Labs (all labs ordered are listed, but only abnormal results are displayed) Labs Reviewed  CBC - Abnormal; Notable for the following components:      Result Value   RBC 5.19 (*)    HCT 46.6 (*)    All other components within normal limits  BASIC METABOLIC PANEL  I-STAT TROPONIN, ED  I-STAT BETA HCG BLOOD, ED ([facility], [facility], [facility] ONLY)  I-STAT TROPONIN, ED    EKG EKG Interpretation  Date/Time:  Wednesday June 13 2017 11:51:06 EDT Ventricular Rate:  79 PR Interval:  168 QRS Duration: 84 QT Interval:  378 QTC Calculation: 433 R Axis:   41 Text Interpretation:  Normal sinus rhythm Possible Anterior infarct , age undetermined Abnormal ECG Confirmed  by Kristine Royal [phone]) on 06/13/2017 3:58:31 PM   Radiology Dg Chest 2 View  Result Date: 06/13/2017 CLINICAL DATA:  57 year old female with history of chest pain and jaw pain since 11 a.m. this morning. History of asthma. EXAM: CHEST - 2 VIEW COMPARISON:  No priors. FINDINGS: 9 mm nodular density projecting over the lower right hemithorax seen only on the frontal projection. Lung volumes are normal. No consolidative airspace disease. No pleural effusions. No pneumothorax. No other suspicious appearing pulmonary nodule or mass noted. Pulmonary vasculature and the cardiomediastinal silhouette are within normal limits. IMPRESSION: 1. No radiographic evidence of acute cardiopulmonary disease. 2. 9 mm nodular density projecting over the lower right lung seen only on the frontal projection. This may represent a benign finding such as costochondral cartilage calcification, or potentially a calcified granuloma (although this would be expected be visualized on the lateral projection). Repeat PA and lateral chest radiograph is recommended in 2-3  weeks to ensure resolution of this finding. If this finding remains on the follow-up chest x-ray, further evaluation with nonemergent chest CT would be recommended in the near future. Electronically Signed   By: Trudie Reed M.D.   On: 06/13/2017 12:35    Procedures Procedures (including critical care time)  Medications Ordered in ED Medications - No data to display   Initial Impression / Assessment and Plan / ED Course  I have reviewed the triage vital signs and the nursing notes.  Pertinent labs & imaging results that were available during my care of the patient were reviewed by me and considered in my medical decision making (see chart for details).     MDM  Screen complete  Patient is presenting for evaluation of atypical chest pain.  Patient's initial EKG is without evidence of acute ischemia.  Troponin x2 is negative.  Patient's heart score is 3.  Other screening labs do not reveal any other significant abnormality.  Chest x-ray is without acute abnormality.  Patient feels improved following her ED work-up and evaluation.  She desires discharge home.  She declines further work-up in the ED and/or admission.  Importance of close follow-up was stressed.  Strict return precautions are given and understood.    Final Clinical Impressions(s) / ED Diagnoses   Final diagnoses:  Atypical chest pain    ED Discharge Orders    None       Wynetta Fines, MD 06/13/17 2324

## 2017-06-13 NOTE — Discharge Instructions (Addendum)
Any problem.  Follow-up with your regular doctor in 1 to 2 days.  Follow-up with cardiology tomorrow as instructed.

## 2017-06-14 ENCOUNTER — Encounter: Payer: Self-pay | Admitting: Family Medicine

## 2017-06-14 ENCOUNTER — Ambulatory Visit: Payer: 59 | Admitting: Family Medicine

## 2017-06-14 VITALS — BP 126/74 | HR 66 | Temp 98.4°F | Resp 16 | Wt 275.0 lb

## 2017-06-14 DIAGNOSIS — K219 Gastro-esophageal reflux disease without esophagitis: Secondary | ICD-10-CM | POA: Diagnosis not present

## 2017-06-14 DIAGNOSIS — R079 Chest pain, unspecified: Secondary | ICD-10-CM | POA: Diagnosis not present

## 2017-06-14 DIAGNOSIS — E668 Other obesity: Secondary | ICD-10-CM

## 2017-06-14 NOTE — Progress Notes (Signed)
Patient: Carolyn Walton Female    DOB: November 26, 1960   57 y.o.   MRN: [id number] Visit Date: 06/14/2017  Today's Provider: Megan Mans, MD   Chief Complaint  Patient presents with  . Hospitalization Follow-up   Subjective:    HPI  Follow Up ER Visit  Patient is here for ER follow up.  She was recently seen at [facility] for chest pain on 06/13/2017. Treatment for this included no new treatment.   She reports this condition is Improved. She also reports that her work up for chest pain and elevated BP was completley negative.      Allergies  Allergen Reactions  . Molds & Smuts Other (See Comments)    Sneezing  . Other Other (See Comments)    CATS AND DOGS     Current Outpatient Medications:  .  albuterol (VENTOLIN HFA) 108 (90 Base) MCG/ACT inhaler, Inhale 2 puffs into the lungs every 4 (four) hours as needed for wheezing or shortness of breath., Disp: 18 g, Rfl: 12 .  cetirizine (ZYRTEC) 10 MG tablet, Take 10 mg by mouth daily., Disp: , Rfl:  .  citalopram (CELEXA) 20 MG tablet, , Disp: , Rfl:  .  doxylamine, Sleep, (UNISOM) 25 MG tablet, Take 25 mg by mouth at bedtime as needed. Reported on 03/20/2015, Disp: , Rfl:  .  Fluticasone-Salmeterol (ADVAIR) 250-50 MCG/DOSE AEPB, Inhale 1 puff into the lungs daily., Disp: 180 each, Rfl: 3 .  montelukast (SINGULAIR) 10 MG tablet, Take 1 tablet (10 mg total) by mouth at bedtime., Disp: 90 tablet, Rfl: 3 .  topiramate (TOPAMAX) 100 MG tablet, Take 1 tablet (100 mg total) by mouth daily., Disp: 90 tablet, Rfl: 3 .  azithromycin (ZITHROMAX Z-PAK) 250 MG tablet, Take 2 tablets the first day than 1 tablet every day after till completed (Patient not taking: Reported on 06/13/2017), Disp: 6 each, Rfl: 0 .  predniSONE (DELTASONE) 10 MG tablet, Take 1 tablet (10 mg total) by mouth daily with breakfast. (Patient not taking: Reported on 01/31/2017), Disp: 21 tablet, Rfl: 0 .  predniSONE (STERAPRED UNI-PAK 21 TAB) 10 MG (21) TBPK  tablet, Take 6 tablets on the first day then decrease by one daily until finished. (Patient not taking: Reported on 06/13/2017), Disp: 21 tablet, Rfl: 0 .  SUMAtriptan (IMITREX) 100 MG tablet, Take 1 tablet (100 mg total) by mouth once., Disp: 10 tablet, Rfl: 12  Review of Systems  Constitutional: Negative for activity change, appetite change, chills, diaphoresis, fatigue, fever and unexpected weight change.  HENT: Negative.   Eyes: Negative.   Respiratory: Negative for cough, shortness of breath and wheezing.   Cardiovascular: Negative for chest pain, palpitations and leg swelling.  Musculoskeletal: Negative for arthralgias.  Neurological: Negative for dizziness, light-headedness, numbness and headaches.  Psychiatric/Behavioral: Negative.     Social History   Tobacco Use  . Smoking status: Never Smoker  . Smokeless tobacco: Never Used  Substance Use Topics  . Alcohol use: Yes    Comment: wine occasionally   Objective:   BP 126/74 (BP Location: Right Arm, Patient Position: Sitting, Cuff Size: Large)   Pulse 66   Temp 98.4 F (36.9 C)   Resp 16   Wt 275 lb (124.7 kg)   SpO2 96%   BMI 55.54 kg/m  Vitals:   06/14/17 1054  BP: 126/74  Pulse: 66  Resp: 16  Temp: 98.4 F (36.9 C)  SpO2: 96%  Weight: 275 lb (124.7 kg)  Physical Exam  Constitutional: She is oriented to person, place, and time. She appears well-developed and well-nourished.  HENT:  Head: Normocephalic and atraumatic.  Right Ear: External ear normal.  Left Ear: External ear normal.  Nose: Nose normal.  Mouth/Throat: Oropharynx is clear and moist.  Eyes: No scleral icterus.  Neck: No thyromegaly present.  Cardiovascular: Normal rate, regular rhythm and normal heart sounds.  Pulmonary/Chest: Effort normal and breath sounds normal.  Abdominal: Soft.  Musculoskeletal: She exhibits no edema.  Lymphadenopathy:    She has no cervical adenopathy.  Neurological: She is alert and oriented to person, place,  and time.  Skin: Skin is warm and dry.  Psychiatric: She has a normal mood and affect. Her behavior is normal. Judgment and thought content normal.        Assessment & Plan:     Chest Pain Noncardiac Possible GERD Obesity Asthma RTC 1 month.     I have done the exam and reviewed the above chart and it is accurate to the best of my knowledge. Dentist[employer]agon  technology has been used in this note in any air is in the dictation or transcription are unintentional.  Megan Mansichard Gilbert Jr, MD  [facility]

## 2017-07-15 ENCOUNTER — Other Ambulatory Visit: Payer: Self-pay | Admitting: Family Medicine

## 2017-07-15 DIAGNOSIS — J4520 Mild intermittent asthma, uncomplicated: Secondary | ICD-10-CM

## 2017-07-18 ENCOUNTER — Other Ambulatory Visit: Payer: Self-pay | Admitting: Family Medicine

## 2017-07-18 MED ORDER — SUMATRIPTAN SUCCINATE 100 MG PO TABS: 100.0000 mg | ORAL_TABLET | Freq: Every day | ORAL | 12 refills | Status: DC | PRN

## 2017-07-18 NOTE — Telephone Encounter (Signed)
[facility] refill [facility] for the following medication. Thanks CC  SUMAtriptan (IMITREX) 100 MG tablet

## 2017-07-23 ENCOUNTER — Other Ambulatory Visit: Payer: Self-pay | Admitting: Family Medicine

## 2017-07-25 ENCOUNTER — Ambulatory Visit
Admission: RE | Admit: 2017-07-25 | Discharge: 2017-07-25 | Disposition: A | Discharge status: Home / Self Care | Payer: 59 | Source: Ambulatory Visit | Attending: Family Medicine | Admitting: Family Medicine

## 2017-07-25 ENCOUNTER — Ambulatory Visit: Payer: 59 | Admitting: Family Medicine

## 2017-07-25 ENCOUNTER — Telehealth: Payer: Self-pay | Admitting: *Deleted

## 2017-07-25 ENCOUNTER — Encounter: Payer: Self-pay | Admitting: Family Medicine

## 2017-07-25 VITALS — BP 130/72 | HR 80 | Ht 60.0 in | Wt 280.0 lb

## 2017-07-25 DIAGNOSIS — R918 Other nonspecific abnormal finding of lung field: Secondary | ICD-10-CM | POA: Diagnosis not present

## 2017-07-25 DIAGNOSIS — R911 Solitary pulmonary nodule: Secondary | ICD-10-CM | POA: Diagnosis not present

## 2017-07-25 DIAGNOSIS — E668 Other obesity: Secondary | ICD-10-CM

## 2017-07-25 DIAGNOSIS — G43909 Migraine, unspecified, not intractable, without status migrainosus: Secondary | ICD-10-CM | POA: Diagnosis not present

## 2017-07-25 MED ORDER — TOPIRAMATE 100 MG PO TABS: 150.0000 mg | ORAL_TABLET | Freq: Every day | ORAL | 3 refills | Status: DC

## 2017-07-25 NOTE — Telephone Encounter (Signed)
Patient was notified of results. Expressed understanding.  

## 2017-07-25 NOTE — Telephone Encounter (Signed)
No answer and no vm. Will try again later.  

## 2017-07-25 NOTE — Telephone Encounter (Signed)
-----   Message from Maple Hudsonichard L Gilbert Jr., MD sent at 07/25/2017  2:32 PM EDT ----- CXR completely stable.

## 2017-07-25 NOTE — Progress Notes (Signed)
Patient: Carolyn Walton Female    DOB: 12-14-1960   57 y.o.   MRN: [id number] Visit Date: 07/25/2017  Today's Provider: Megan Mansichard Gilbert Jr, MD   Chief Complaint  Patient presents with  . Hypertension  . Asthma  . weight management   Subjective:    HPI Patient comes in today for a 1 month follow up on blood pressure. She was seen in the ER about 1 month ago c/o chest pain. She reports that she has not had any other symptoms since then. She does not check her BP at home. She denies any headaches, shortness of breath, or dizziness. BP Readings from Last 3 Encounters:  07/25/17 130/72  06/14/17 126/74  06/13/17 (!) 150/64    Patient also mentions that she is very frustrated about her weight. She reports that she continues to gain weight.  Wt Readings from Last 3 Encounters:  07/25/17 280 lb (127 kg)  06/14/17 275 lb (124.7 kg)  06/13/17 272 lb (123.4 kg)       Allergies  Allergen Reactions  . Molds & Smuts Other (See Comments)    Sneezing  . Other Other (See Comments)    CATS AND DOGS     Current Outpatient Medications:  .  albuterol (VENTOLIN HFA) 108 (90 Base) MCG/ACT inhaler, Inhale 2 puffs into the lungs every 4 (four) hours as needed for wheezing or shortness of breath., Disp: 18 g, Rfl: 12 .  cetirizine (ZYRTEC) 10 MG tablet, Take 10 mg by mouth daily., Disp: , Rfl:  .  citalopram (CELEXA) 20 MG tablet, , Disp: , Rfl:  .  doxylamine, Sleep, (UNISOM) 25 MG tablet, Take 25 mg by mouth at bedtime as needed. Reported on 03/20/2015, Disp: , Rfl:  .  Fluticasone-Salmeterol (ADVAIR) 250-50 MCG/DOSE AEPB, Inhale 1 puff into the lungs daily., Disp: 180 each, Rfl: 3 .  montelukast (SINGULAIR) 10 MG tablet, TAKE ONE TABLET AT BED- TIME., Disp: 30 tablet, Rfl: 11 .  SUMAtriptan (IMITREX) 100 MG tablet, TAKE 1 TABLET DAILY., Disp: 9 tablet, Rfl: 5 .  topiramate (TOPAMAX) 100 MG tablet, Take 1 tablet (100 mg total) by mouth daily., Disp: 90 tablet, Rfl: 3 .  azithromycin  (ZITHROMAX Z-PAK) 250 MG tablet, Take 2 tablets the first day than 1 tablet every day after till completed (Patient not taking: Reported on 06/13/2017), Disp: 6 each, Rfl: 0 .  predniSONE (DELTASONE) 10 MG tablet, Take 1 tablet (10 mg total) by mouth daily with breakfast. (Patient not taking: Reported on 01/31/2017), Disp: 21 tablet, Rfl: 0 .  predniSONE (STERAPRED UNI-PAK 21 TAB) 10 MG (21) TBPK tablet, Take 6 tablets on the first day then decrease by one daily until finished. (Patient not taking: Reported on 06/13/2017), Disp: 21 tablet, Rfl: 0  Review of Systems  Constitutional: Negative for activity change, appetite change, chills, diaphoresis and fatigue.  HENT: Negative.   Eyes: Negative.   Respiratory: Negative for cough and shortness of breath.   Cardiovascular: Negative for chest pain, palpitations and leg swelling.  Gastrointestinal: Negative.   Endocrine: Negative.   Musculoskeletal: Negative.   Allergic/Immunologic: Negative.   Neurological: Negative for dizziness, light-headedness and headaches.  Psychiatric/Behavioral: Negative.     Social History   Tobacco Use  . Smoking status: Never Smoker  . Smokeless tobacco: Never Used  Substance Use Topics  . Alcohol use: Yes    Comment: wine occasionally   Objective:   BP 130/72 (BP Location: Right Leg, Patient Position: Sitting,  Cuff Size: Large)   Pulse 80   Ht 5' (1.524 m)   Wt 280 lb (127 kg)   SpO2 96%   BMI 54.68 kg/m  Vitals:   07/25/17 1014  BP: 130/72  Pulse: 80  SpO2: 96%  Weight: 280 lb (127 kg)  Height: 5' (1.524 m)     Physical Exam  Constitutional: She is oriented to person, place, and time. She appears well-developed and well-nourished.  HENT:  Head: Normocephalic and atraumatic.  Right Ear: External ear normal.  Left Ear: External ear normal.  Nose: Nose normal.  Eyes: Conjunctivae are normal. No scleral icterus.  Neck: No thyromegaly present.  Cardiovascular: Normal rate, regular rhythm and  normal heart sounds.  Pulmonary/Chest: Effort normal and breath sounds normal.  Abdominal: Soft.  Musculoskeletal: She exhibits no edema.  Neurological: She is alert and oriented to person, place, and time.  Skin: Skin is warm and dry.  Psychiatric: She has a normal mood and affect. Her behavior is normal. Judgment and thought content normal.        Assessment & Plan:     1. Migraine without status migrainosus, not intractable, unspecified migraine type  - topiramate (TOPAMAX) 100 MG tablet; Take 1.5 tablets (150 mg total) by mouth daily.  Dispense: 135 tablet; Refill: 3  2. Pulmonary nodule Almost certainly benign - DG Chest 2 View; Future 3.Obesity Consider referral to Bariatrics.  I have done the exam and reviewed the chart and it is accurate to the best of my knowledge. Dentist has been used and  any errors in dictation or transcription are unintentional. Julieanne Manson M.D. [facility]       Megan Mans, MD  [facility]

## 2017-08-23 ENCOUNTER — Ambulatory Visit: Payer: Self-pay | Admitting: Family Medicine

## 2017-09-26 ENCOUNTER — Encounter: Payer: Self-pay | Admitting: Family Medicine

## 2017-09-26 ENCOUNTER — Ambulatory Visit: Payer: 59 | Admitting: Family Medicine

## 2017-09-26 VITALS — BP 148/90 | HR 78 | Temp 98.6°F | Resp 16 | Wt 280.6 lb

## 2017-09-26 DIAGNOSIS — Z23 Encounter for immunization: Secondary | ICD-10-CM | POA: Diagnosis not present

## 2017-09-26 DIAGNOSIS — E668 Other obesity: Secondary | ICD-10-CM

## 2017-09-26 DIAGNOSIS — F329 Major depressive disorder, single episode, unspecified: Secondary | ICD-10-CM

## 2017-09-26 DIAGNOSIS — G43909 Migraine, unspecified, not intractable, without status migrainosus: Secondary | ICD-10-CM | POA: Diagnosis not present

## 2017-09-26 MED ORDER — SERTRALINE HCL 50 MG PO TABS: 50.0000 mg | ORAL_TABLET | Freq: Every day | ORAL | 12 refills | Status: DC

## 2017-09-26 MED ORDER — TOPIRAMATE 200 MG PO TABS: 200.0000 mg | ORAL_TABLET | Freq: Every day | ORAL | 12 refills | Status: DC

## 2017-09-26 NOTE — Progress Notes (Signed)
Patient: Carolyn Walton Female    DOB: 30-Aug-1960   57 y.o.   MRN: [id number] Visit Date: 09/26/2017  Today's Provider: Megan Mans, MD   Chief Complaint  Patient presents with  . Migraine  . Obesity   Subjective:    Migraine   Chronicity: follow up visit from 07/25/17. The problem occurs intermittently. The problem has been gradually improving. The pain is located in the bilateral region. The pain quality is not similar to prior headaches. The quality of the pain is described as pulsating. Pertinent negatives include no abdominal pain, abnormal behavior, anorexia, back pain, blurred vision, coughing, dizziness, drainage, ear pain, eye pain, eye redness, eye watering, facial sweating, fever, hearing loss, insomnia, loss of balance, muscle aches, nausea, neck pain, numbness, phonophobia, photophobia, rhinorrhea, scalp tenderness, seizures, sinus pressure, sore throat, swollen glands, tingling, tinnitus, visual change, vomiting or weakness. The symptoms are aggravated by emotional stress. Treatments tried: Topamax 100mg . The treatment provided significant relief.      Obesity Patient returns back to office today for follow up from 07/25/17, patient states that her weight continues to climb up. Patient reports that she keeps a food diary and has only been eating grilled meat and vegetables,patient would like to discuss medication options today and she has already decided that she would not like to for go with surgery.   Allergies  Allergen Reactions  . Molds & Smuts Other (See Comments)    Sneezing  . Other Other (See Comments)    CATS AND DOGS     Current Outpatient Medications:  .  albuterol (VENTOLIN HFA) 108 (90 Base) MCG/ACT inhaler, Inhale 2 puffs into the lungs every 4 (four) hours as needed for wheezing or shortness of breath., Disp: 18 g, Rfl: 12 .  cetirizine (ZYRTEC) 10 MG tablet, Take 10 mg by mouth daily., Disp: , Rfl:  .  citalopram (CELEXA) 20 MG tablet, ,  Disp: , Rfl:  .  doxylamine, Sleep, (UNISOM) 25 MG tablet, Take 25 mg by mouth at bedtime as needed. Reported on 03/20/2015, Disp: , Rfl:  .  Fluticasone-Salmeterol (ADVAIR) 250-50 MCG/DOSE AEPB, Inhale 1 puff into the lungs daily., Disp: 180 each, Rfl: 3 .  montelukast (SINGULAIR) 10 MG tablet, TAKE ONE TABLET AT BED- TIME., Disp: 30 tablet, Rfl: 11 .  SUMAtriptan (IMITREX) 100 MG tablet, TAKE 1 TABLET DAILY., Disp: 9 tablet, Rfl: 5 .  topiramate (TOPAMAX) 100 MG tablet, Take 1.5 tablets (150 mg total) by mouth daily., Disp: 135 tablet, Rfl: 3  Review of Systems  Constitutional: Negative for fever.  HENT: Negative for ear pain, hearing loss, rhinorrhea, sinus pressure, sore throat and tinnitus.   Eyes: Negative for blurred vision, photophobia, pain and redness.  Respiratory: Negative for cough.   Gastrointestinal: Negative for abdominal pain, anorexia, nausea and vomiting.  Endocrine: Negative.   Musculoskeletal: Negative for back pain and neck pain.  Allergic/Immunologic: Negative.   Neurological: Negative for dizziness, tingling, seizures, weakness, numbness and loss of balance.  Psychiatric/Behavioral: Positive for dysphoric mood. The patient does not have insomnia.     Social History   Tobacco Use  . Smoking status: Never Smoker  . Smokeless tobacco: Never Used  Substance Use Topics  . Alcohol use: Yes    Comment: wine occasionally   Objective:   BP (!) 148/90   Pulse 78   Temp 98.6 F (37 C) (Oral)   Resp 16   Wt 280 lb 9.6 oz (127.3 kg)  SpO2 98%   BMI 54.80 kg/m  Vitals:   09/26/17 1616  BP: (!) 148/90  Pulse: 78  Resp: 16  Temp: 98.6 F (37 C)  TempSrc: Oral  SpO2: 98%  Weight: 280 lb 9.6 oz (127.3 kg)     Physical Exam  Constitutional: She is oriented to person, place, and time. She appears well-developed and well-nourished.  HENT:  Head: Normocephalic and atraumatic.  Right Ear: External ear normal.  Left Ear: External ear normal.  Nose: Nose  normal.  Eyes: Conjunctivae are normal. No scleral icterus.  Neck: No thyromegaly present.  Cardiovascular: Normal rate, regular rhythm and normal heart sounds.  Pulmonary/Chest: Effort normal and breath sounds normal.  Abdominal: Soft.  Musculoskeletal: She exhibits no edema.  Neurological: She is alert and oriented to person, place, and time.  Skin: Skin is warm and dry.  Psychiatric: She has a normal mood and affect. Her behavior is normal. Judgment and thought content normal.        Assessment & Plan:     1. Major depressive disorder with current active episode, unspecified depression episode severity, unspecified whether recurrent Pt would benefit from counseling. - sertraline (ZOLOFT) 50 MG tablet; Take 1 tablet (50 mg total) by mouth daily.  Dispense: 30 tablet; Refill: 12 - Ambulatory referral to Psychiatry  2. Migraine without status migrainosus, not intractable, unspecified migraine type Increase--may help with weight loss. - topiramate (TOPAMAX) 200 MG tablet; Take 1 tablet (200 mg total) by mouth daily.  Dispense: 30 tablet; Refill: 12 3.Morbid Obesity Pt declines referral to bariatric. Consider nutrition referral.     I have done the exam and reviewed the above chart and it is accurate to the best of my knowledge. Dentist has been used in this note in any air is in the dictation or transcription are unintentional.  Megan Mans, MD  [facility]

## 2017-10-25 ENCOUNTER — Encounter: Payer: Self-pay | Admitting: Family Medicine

## 2017-10-25 ENCOUNTER — Ambulatory Visit: Payer: 59 | Admitting: Family Medicine

## 2017-10-25 VITALS — BP 120/64 | HR 66 | Temp 98.3°F | Resp 16

## 2017-10-25 DIAGNOSIS — E668 Other obesity: Secondary | ICD-10-CM | POA: Diagnosis not present

## 2017-10-25 DIAGNOSIS — G43909 Migraine, unspecified, not intractable, without status migrainosus: Secondary | ICD-10-CM

## 2017-10-25 DIAGNOSIS — J309 Allergic rhinitis, unspecified: Secondary | ICD-10-CM

## 2017-10-25 DIAGNOSIS — F324 Major depressive disorder, single episode, in partial remission: Secondary | ICD-10-CM

## 2017-10-25 DIAGNOSIS — J4521 Mild intermittent asthma with (acute) exacerbation: Secondary | ICD-10-CM

## 2017-10-25 NOTE — Progress Notes (Signed)
Patient: Carolyn Walton Female    DOB: 1960/03/10   57 y.o.   MRN: [id number] Visit Date: 10/25/2017  Today's Provider: Megan Mans, MD   Chief Complaint  Patient presents with  . Depression  . Obesity   Subjective:    HPI Patient comes in today for a 1 month follow up. She was advised to continue sertaline 50mg  daily, and referred to psychiatry.   She was also advised to increase Topamax to 200mg  daily. She reports that she has tolerated med changes well.     Allergies  Allergen Reactions  . Molds & Smuts Other (See Comments)    Sneezing  . Other Other (See Comments)    CATS AND DOGS     Current Outpatient Medications:  .  albuterol (VENTOLIN HFA) 108 (90 Base) MCG/ACT inhaler, Inhale 2 puffs into the lungs every 4 (four) hours as needed for wheezing or shortness of breath., Disp: 18 g, Rfl: 12 .  cetirizine (ZYRTEC) 10 MG tablet, Take 10 mg by mouth daily., Disp: , Rfl:  .  citalopram (CELEXA) 20 MG tablet, , Disp: , Rfl:  .  doxylamine, Sleep, (UNISOM) 25 MG tablet, Take 25 mg by mouth at bedtime as needed. Reported on 03/20/2015, Disp: , Rfl:  .  Fluticasone-Salmeterol (ADVAIR) 250-50 MCG/DOSE AEPB, Inhale 1 puff into the lungs daily., Disp: 180 each, Rfl: 3 .  montelukast (SINGULAIR) 10 MG tablet, TAKE ONE TABLET AT BED- TIME., Disp: 30 tablet, Rfl: 11 .  sertraline (ZOLOFT) 50 MG tablet, Take 1 tablet (50 mg total) by mouth daily., Disp: 30 tablet, Rfl: 12 .  SUMAtriptan (IMITREX) 100 MG tablet, TAKE 1 TABLET DAILY., Disp: 9 tablet, Rfl: 5 .  topiramate (TOPAMAX) 200 MG tablet, Take 1 tablet (200 mg total) by mouth daily., Disp: 30 tablet, Rfl: 12  Review of Systems  Constitutional: Negative for activity change, chills and fatigue.  HENT: Negative.   Eyes: Negative.   Respiratory: Negative for cough and shortness of breath.   Cardiovascular: Negative for chest pain, palpitations and leg swelling.  Endocrine: Negative.   Musculoskeletal: Negative for  arthralgias, gait problem and joint swelling.  Allergic/Immunologic: Negative.   Neurological: Negative for dizziness and headaches.  Psychiatric/Behavioral: Negative.     Social History   Tobacco Use  . Smoking status: Never Smoker  . Smokeless tobacco: Never Used  Substance Use Topics  . Alcohol use: Yes    Comment: wine occasionally   Objective:   BP 120/64 (BP Location: Right Arm, Patient Position: Sitting, Cuff Size: Large)   Pulse 66   Temp 98.3 F (36.8 C)   Resp 16   SpO2 98%  Vitals:   10/25/17 1637  BP: 120/64  Pulse: 66  Resp: 16  Temp: 98.3 F (36.8 C)  SpO2: 98%     Physical Exam  Constitutional: She is oriented to person, place, and time. She appears well-developed and well-nourished.  Morbidly obese female in no acute distress.  She is pleasant and cooperative.  HENT:  Head: Normocephalic and atraumatic.  Right Ear: External ear normal.  Left Ear: External ear normal.  Nose: Nose normal.  Eyes: Conjunctivae are normal. No scleral icterus.  Neck: No thyromegaly present.  Cardiovascular: Normal rate, regular rhythm and normal heart sounds.  Pulmonary/Chest: Effort normal and breath sounds normal.  Abdominal: Soft.  Musculoskeletal: She exhibits no edema.  Neurological: She is alert and oriented to person, place, and time.  Skin: Skin is warm  and dry.  Psychiatric: She has a normal mood and affect. Her behavior is normal. Judgment and thought content normal.        Assessment & Plan:     1. Major depressive disorder with single episode, in partial remission (HCC) About a 60% improvement per patient.  Not suicidal at all.  2. Extreme obesity Patient does not want to see dietitian nor does she want to see a bariatric physician.  3. Migraine without status migrainosus, not intractable, unspecified migraine type Proved a good bit since last visit and increase in Topamax dose  4. Allergic rhinitis, unspecified seasonality, unspecified  trigger   5. Mild intermittent asthma with acute exacerbation Stable.       Taji Barretto Wendelyn Breslow, MD  [facility]

## 2018-01-28 ENCOUNTER — Ambulatory Visit: Payer: 59 | Admitting: Family Medicine

## 2018-04-08 DIAGNOSIS — L821 Other seborrheic keratosis: Secondary | ICD-10-CM | POA: Diagnosis not present

## 2018-04-08 DIAGNOSIS — L2389 Allergic contact dermatitis due to other agents: Secondary | ICD-10-CM | POA: Diagnosis not present

## 2018-06-12 ENCOUNTER — Other Ambulatory Visit: Payer: Self-pay | Admitting: Family Medicine

## 2018-06-12 DIAGNOSIS — J4520 Mild intermittent asthma, uncomplicated: Secondary | ICD-10-CM

## 2018-07-04 IMAGING — DX DG CHEST 2V
2 series · 2 of 2 positions shown · non-contrast
Comparison: No priors.

CLINICAL DATA: 56-year-old female with history of chest pain and
jaw pain since 11 a.m. this morning. History of asthma.

EXAM:
CHEST - 2 VIEW

[chest pa]
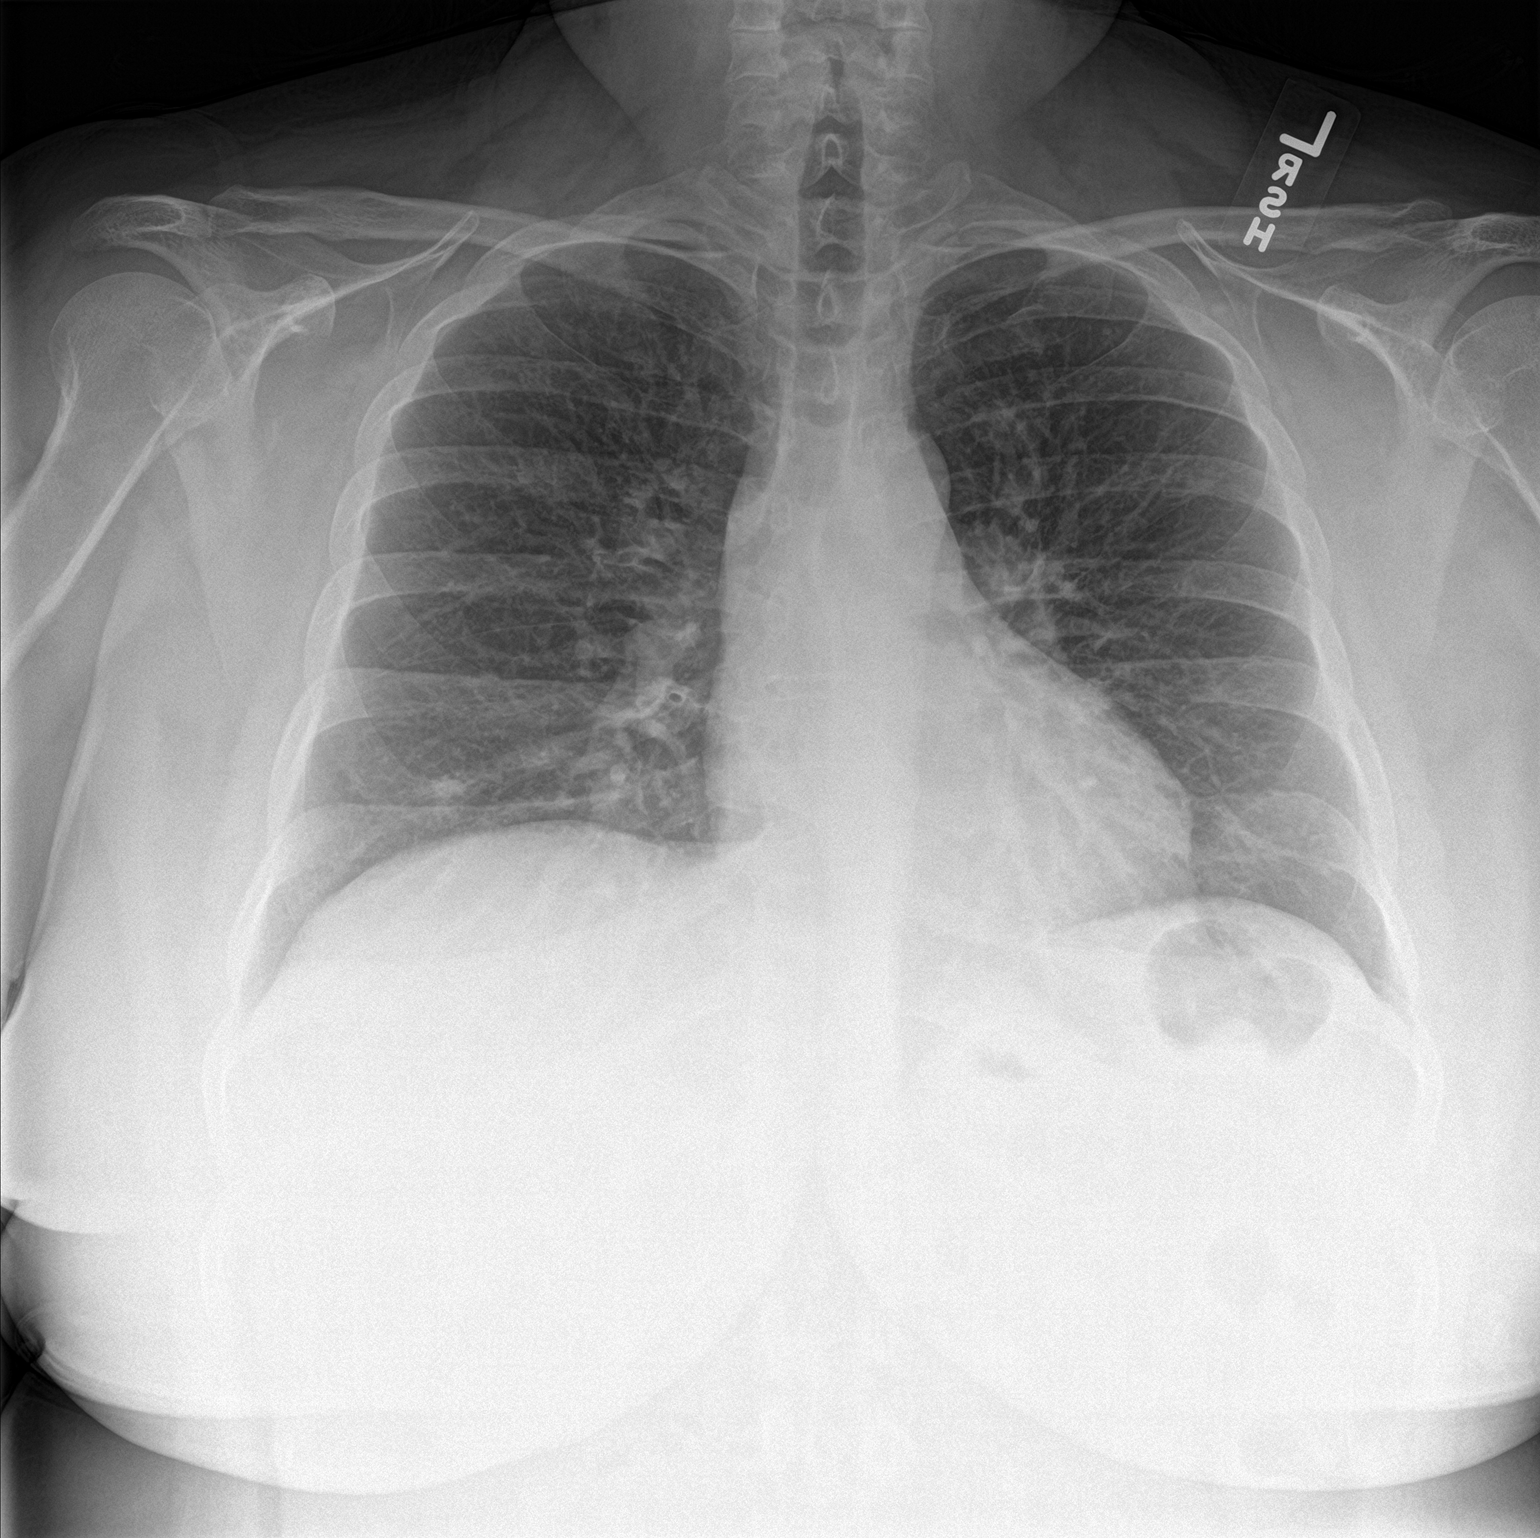

[chest lat]
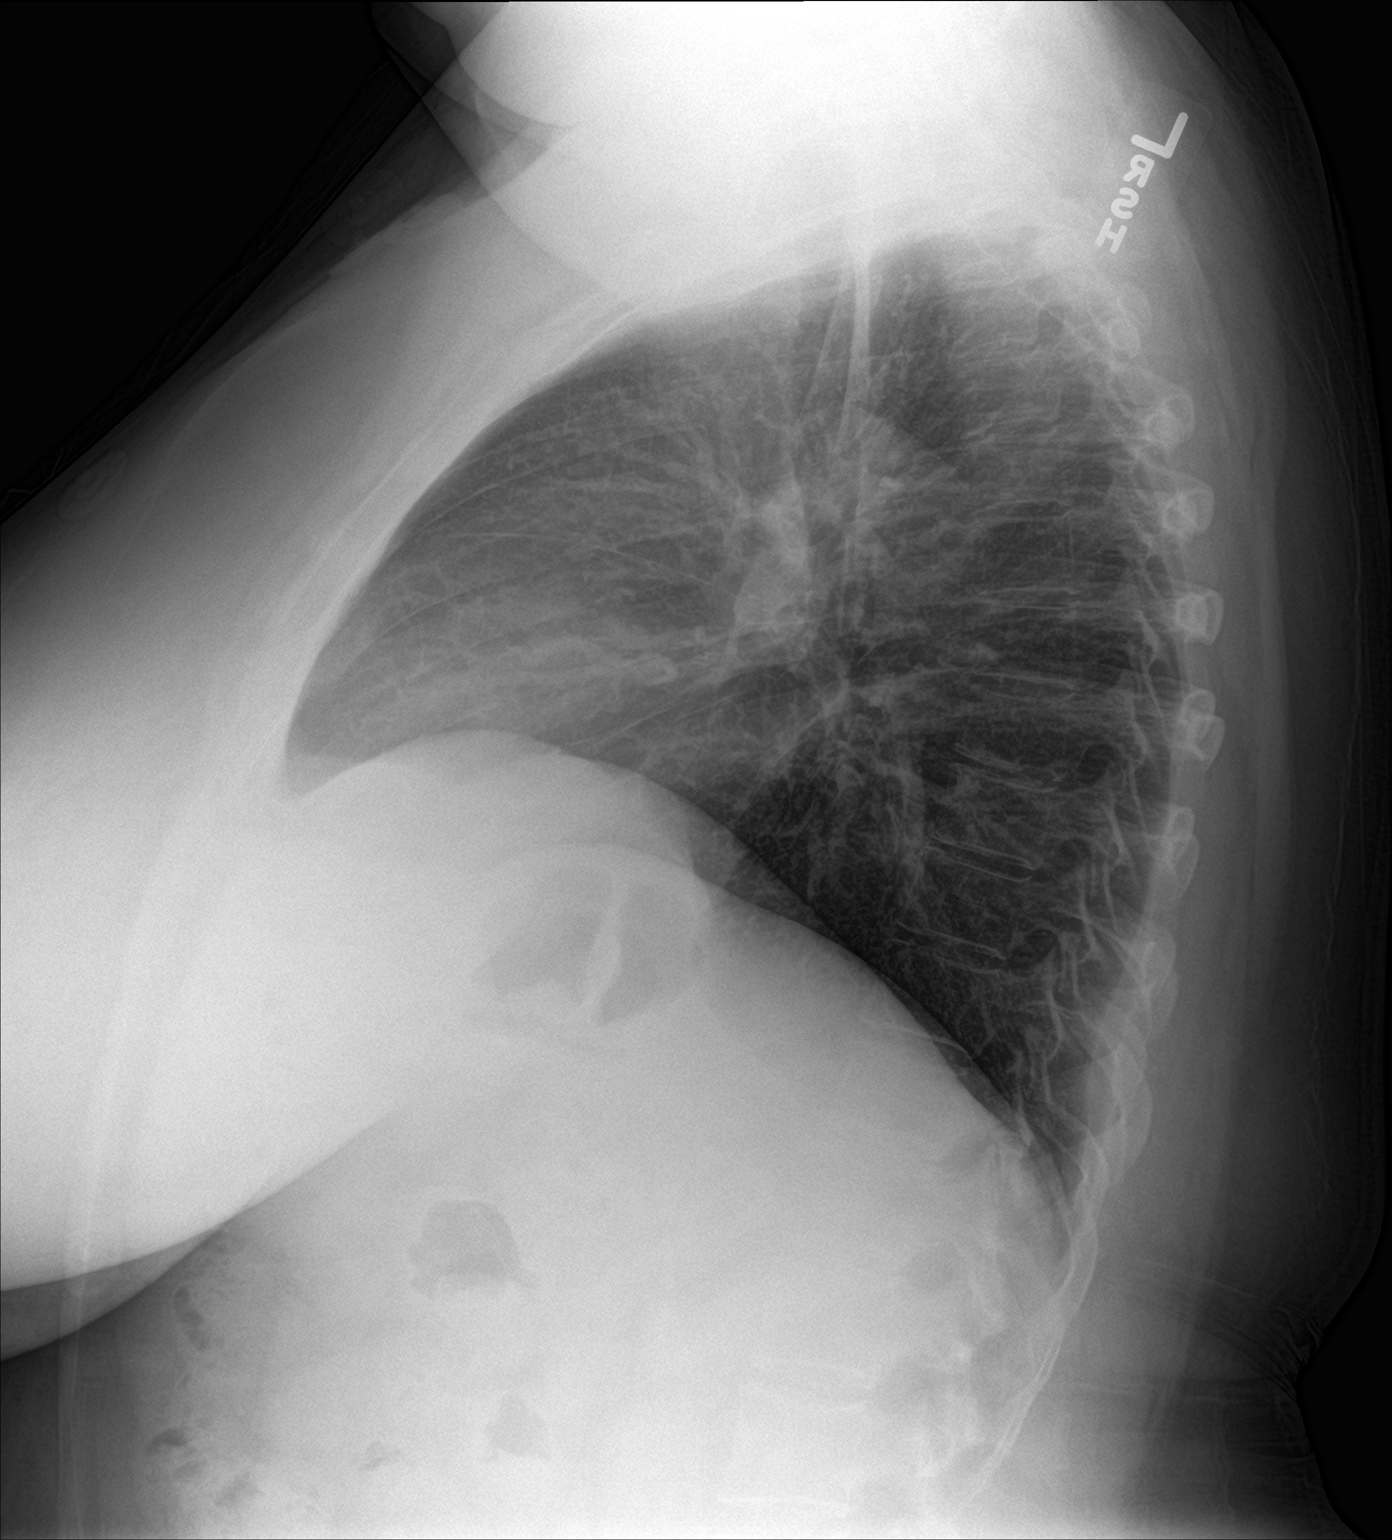

[2 of 2 positions shown; findings below may reference images not displayed]

FINDINGS: 9 mm nodular density projecting over the lower right hemithorax seen
only on the frontal projection. Lung volumes are normal. No
consolidative airspace disease. No pleural effusions. No
pneumothorax. No other suspicious appearing pulmonary nodule or mass
noted. Pulmonary vasculature and the cardiomediastinal silhouette
are within normal limits.
IMPRESSION: 1. No radiographic evidence of acute cardiopulmonary disease.
2. 9 mm nodular density projecting over the lower right lung seen
only on the frontal projection. This may represent a benign finding
such as costochondral cartilage calcification, or potentially a
calcified granuloma (although this would be expected be visualized
on the lateral projection). Repeat PA and lateral chest radiograph
is recommended in 2-3 weeks to ensure resolution of this finding. If
this finding remains on the follow-up chest x-ray, further
evaluation with nonemergent chest CT would be recommended in the
near future.

## 2018-07-10 ENCOUNTER — Other Ambulatory Visit: Payer: Self-pay | Admitting: Family Medicine

## 2018-07-17 ENCOUNTER — Other Ambulatory Visit: Payer: Self-pay | Admitting: Family Medicine

## 2018-07-17 DIAGNOSIS — J4520 Mild intermittent asthma, uncomplicated: Secondary | ICD-10-CM

## 2018-08-15 IMAGING — CR DG CHEST 2V
1 series · 2 of 2 positions shown · non-contrast
Comparison: Multiple exams dating back to 02/09/2009

CLINICAL DATA: Follow-up lung nodule

EXAM:
CHEST - 2 VIEW

[Series 1: dg chest 2 view · 0.14mm/px · 2 of 2 slices shown]
[im 1/2]
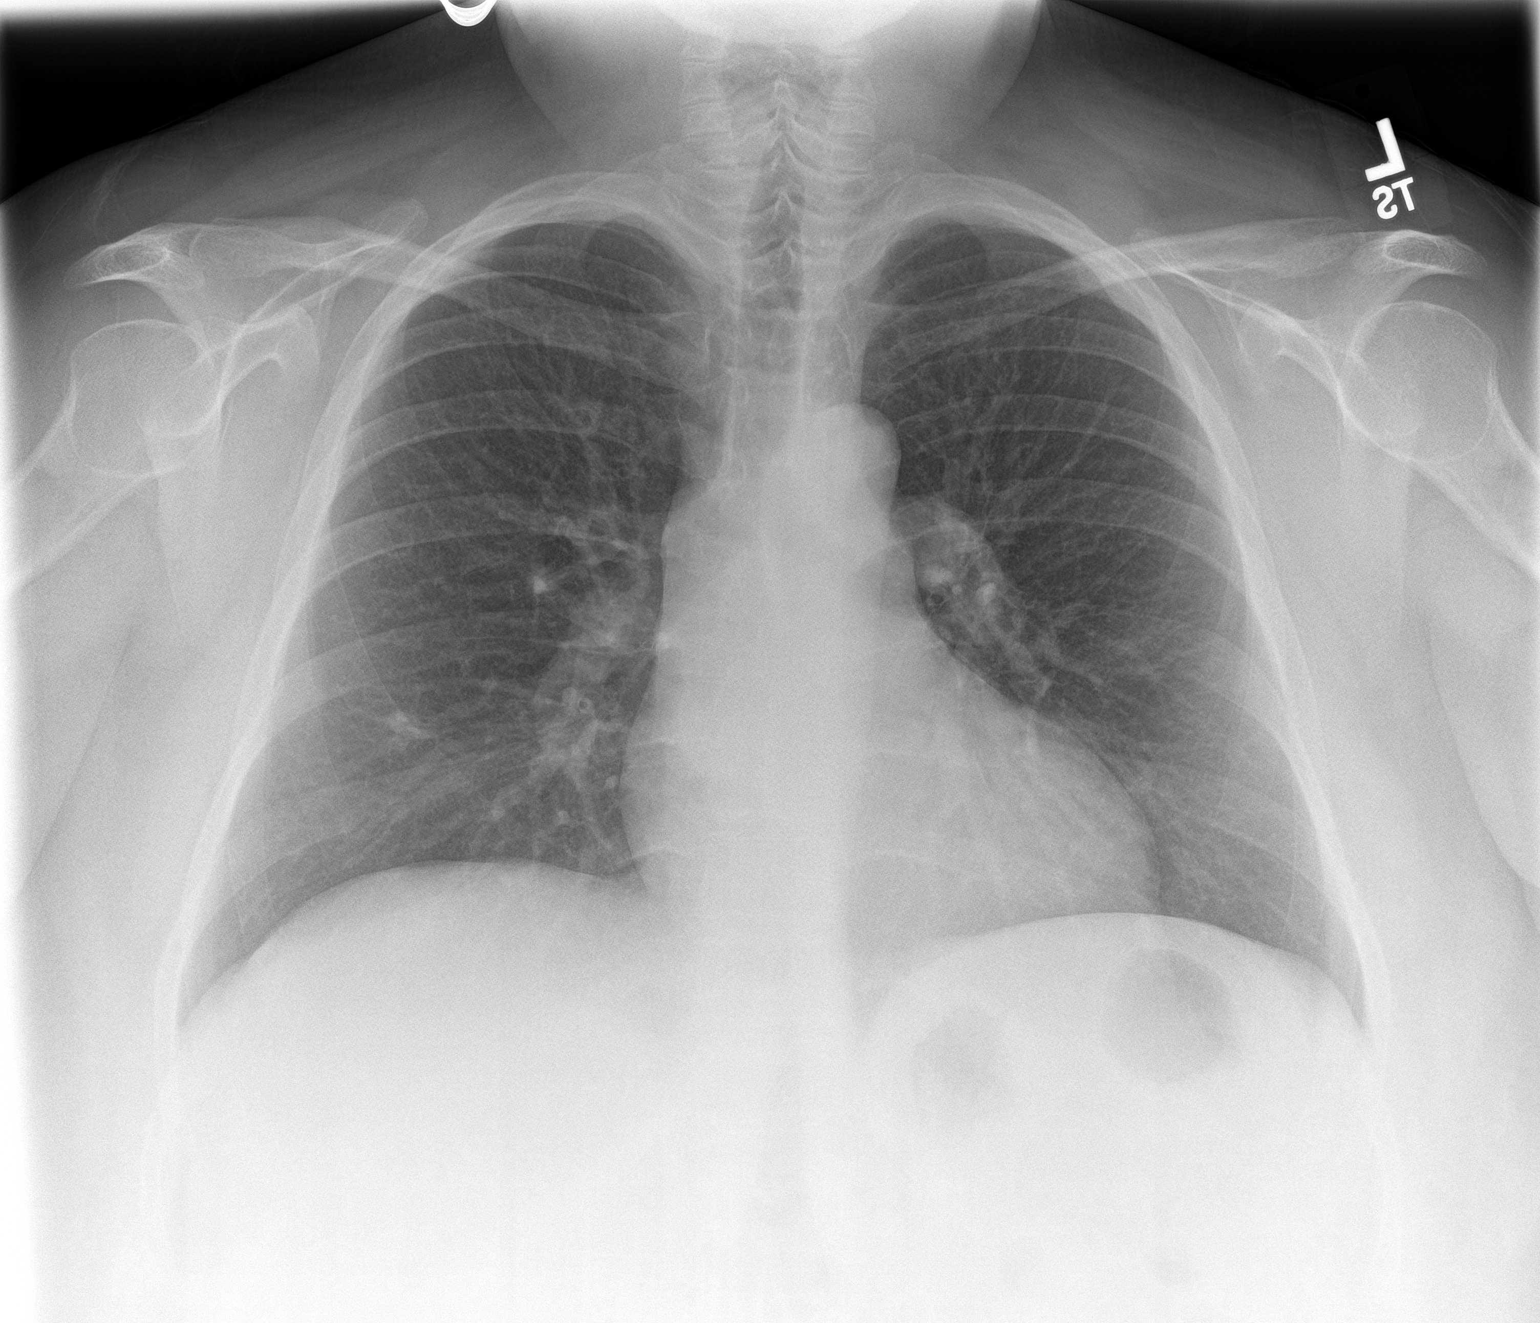
[im 2/2]
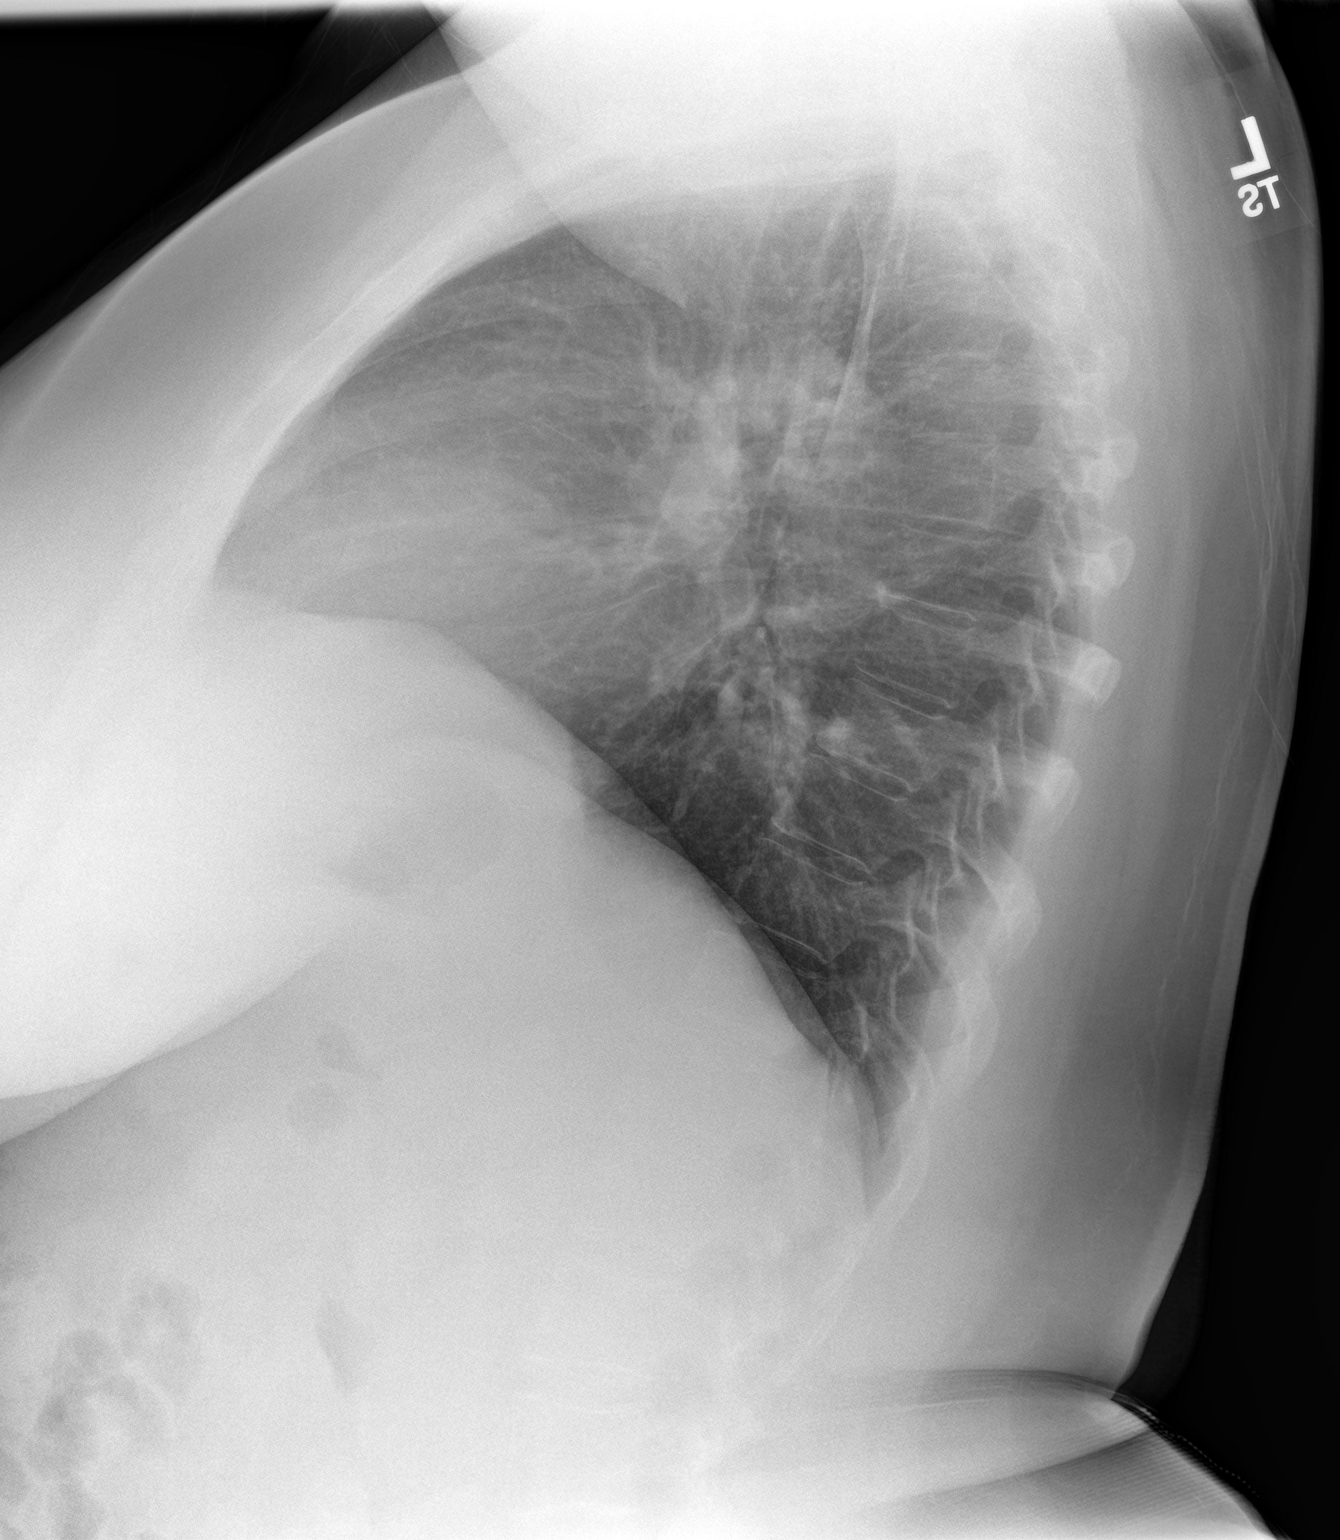

[2 of 2 positions shown; findings below may reference images not displayed]

FINDINGS: Stable nodular density is noted in the right lung base again likely
representing a calcified granuloma. This has been seen on multiple
previous exams which are now available for comparison dating back to
[DATE] consistent with a benign etiology. No new focal infiltrate is
seen. No bony abnormality is noted. Cardiac shadow is stable.
IMPRESSION: Stable nodular density in the right lung base which has been stable
on current leave ala bowl prior exams dating back to 3333. This is
consistent with a benign etiology. No acute abnormality noted.

## 2018-09-30 ENCOUNTER — Telehealth: Payer: Self-pay | Admitting: Family Medicine

## 2018-09-30 DIAGNOSIS — F329 Major depressive disorder, single episode, unspecified: Secondary | ICD-10-CM

## 2018-09-30 MED ORDER — SERTRALINE HCL 100 MG PO TABS: 100.0000 mg | ORAL_TABLET | Freq: Every day | ORAL | 1 refills | Status: DC

## 2018-09-30 NOTE — Telephone Encounter (Signed)
Advised patient as below. Patient reports that she will call back within the next month to schedule F/U appt with Dr. Rosanna Randy.

## 2018-09-30 NOTE — Telephone Encounter (Signed)
Pt would like to increase her generic Zoloft 50 mg.  She said her husband ask her for a divorce on Saturday which was their anniversary.    [place]  CB#  [phone]  Thanks Con Memos

## 2018-09-30 NOTE — Telephone Encounter (Signed)
OK to increase to 100mg  daily of Zoloft (Ok to send e-Rx for #30 r1).  Would recommend therapy as well.  Needs 19m f/u with PCP to see how she is doing on higher dose also.

## 2018-09-30 NOTE — Telephone Encounter (Signed)
Dr. Jacinto Reap, could you review this for Dr. Rosanna Randy? Is it ok if patient increases her Zoloft? She is currently taking 50mg  daily. Please advise. Thanks!

## 2018-10-07 ENCOUNTER — Other Ambulatory Visit: Payer: Self-pay | Admitting: Family Medicine

## 2018-10-07 DIAGNOSIS — F329 Major depressive disorder, single episode, unspecified: Secondary | ICD-10-CM

## 2018-10-07 DIAGNOSIS — G43909 Migraine, unspecified, not intractable, without status migrainosus: Secondary | ICD-10-CM

## 2018-11-04 ENCOUNTER — Other Ambulatory Visit: Payer: Self-pay | Admitting: Family Medicine

## 2018-11-04 DIAGNOSIS — G43909 Migraine, unspecified, not intractable, without status migrainosus: Secondary | ICD-10-CM

## 2018-11-04 DIAGNOSIS — F329 Major depressive disorder, single episode, unspecified: Secondary | ICD-10-CM

## 2018-11-05 ENCOUNTER — Other Ambulatory Visit: Payer: Self-pay | Admitting: *Deleted

## 2018-11-05 ENCOUNTER — Other Ambulatory Visit: Payer: Self-pay

## 2018-11-05 ENCOUNTER — Encounter: Payer: Self-pay | Admitting: Family Medicine

## 2018-11-05 ENCOUNTER — Ambulatory Visit: Payer: 59 | Admitting: Family Medicine

## 2018-11-05 VITALS — BP 133/77 | HR 72 | Temp 97.2°F | Resp 16 | Ht 60.0 in | Wt 285.0 lb

## 2018-11-05 DIAGNOSIS — G43909 Migraine, unspecified, not intractable, without status migrainosus: Secondary | ICD-10-CM

## 2018-11-05 DIAGNOSIS — E668 Other obesity: Secondary | ICD-10-CM

## 2018-11-05 DIAGNOSIS — J4520 Mild intermittent asthma, uncomplicated: Secondary | ICD-10-CM

## 2018-11-05 DIAGNOSIS — F329 Major depressive disorder, single episode, unspecified: Secondary | ICD-10-CM

## 2018-11-05 DIAGNOSIS — Z23 Encounter for immunization: Secondary | ICD-10-CM | POA: Diagnosis not present

## 2018-11-05 DIAGNOSIS — R350 Frequency of micturition: Secondary | ICD-10-CM

## 2018-11-05 DIAGNOSIS — J309 Allergic rhinitis, unspecified: Secondary | ICD-10-CM

## 2018-11-05 DIAGNOSIS — J4521 Mild intermittent asthma with (acute) exacerbation: Secondary | ICD-10-CM

## 2018-11-05 LAB — POCT URINALYSIS DIPSTICK
Appearance: NORMAL
Bilirubin, UA: NEGATIVE
Glucose, UA: NEGATIVE
Nitrite, UA: NEGATIVE
Odor: NORMAL
Protein, UA: POSITIVE — AB
Spec Grav, UA: 1.01 (ref 1.010–1.025)
Urobilinogen, UA: 0.2 E.U./dL
pH, UA: 8 (ref 5.0–8.0)

## 2018-11-05 MED ORDER — FLUTICASONE-SALMETEROL 250-50 MCG/DOSE IN AEPB: 1.0000 | INHALATION_SPRAY | Freq: Every day | RESPIRATORY_TRACT | 3 refills | Status: DC

## 2018-11-05 MED ORDER — SUMATRIPTAN SUCCINATE 100 MG PO TABS: 100.0000 mg | ORAL_TABLET | Freq: Every day | ORAL | 3 refills | Status: DC

## 2018-11-05 MED ORDER — MONTELUKAST SODIUM 10 MG PO TABS: ORAL_TABLET | ORAL | 3 refills | Status: DC

## 2018-11-05 NOTE — Progress Notes (Signed)
Patient: Carolyn Walton Female    DOB: 12-12-1960   58 y.o.   MRN: [id number] Visit Date: 11/05/2018  Today's Provider: Megan Mans, MD   Chief Complaint  Patient presents with  . Follow-up    Migraine   Subjective:     HPI  She is struggling as her husband said he wanted a divorce a month ago. Migraine without status migrainosus, not intractable, unspecified migraine type From 10/25/2017-Proved a good bit since last visit and increase in Topamax 200 mg dose.  Major depressive disorder with single episode, in partial remission (HCC) From 10/25/2017-About a 60% improvement per patient. Currently taking sertraline 100 mg qd.   Allergies  Allergen Reactions  . Molds & Smuts Other (See Comments)    Sneezing  . Other Other (See Comments)    CATS AND DOGS     Current Outpatient Medications:  .  albuterol (VENTOLIN HFA) 108 (90 Base) MCG/ACT inhaler, Inhale 2 puffs into the lungs every 4 (four) hours as needed for wheezing or shortness of breath., Disp: 18 g, Rfl: 12 .  citalopram (CELEXA) 20 MG tablet, , Disp: , Rfl:  .  Fluticasone-Salmeterol (ADVAIR) 250-50 MCG/DOSE AEPB, Inhale 1 puff into the lungs daily., Disp: 180 each, Rfl: 3 .  montelukast (SINGULAIR) 10 MG tablet, TAKE ONE TABLET AT BED- TIME., Disp: 30 tablet, Rfl: 3 .  sertraline (ZOLOFT) 100 MG tablet, Take 1 tablet (100 mg total) by mouth daily., Disp: 30 tablet, Rfl: 1 .  SUMAtriptan (IMITREX) 100 MG tablet, TAKE 1 TABLET DAILY., Disp: 9 tablet, Rfl: 3 .  topiramate (TOPAMAX) 200 MG tablet, Take 1 tablet (200 mg total) by mouth daily., Disp: 30 tablet, Rfl: 0 .  cetirizine (ZYRTEC) 10 MG tablet, Take 10 mg by mouth daily., Disp: , Rfl:  .  doxylamine, Sleep, (UNISOM) 25 MG tablet, Take 25 mg by mouth at bedtime as needed. Reported on 03/20/2015, Disp: , Rfl:  .  sertraline (ZOLOFT) 50 MG tablet, Take 1 tablet (50 mg total) by mouth daily. (Patient not taking: Reported on 11/05/2018), Disp: 30 tablet,  Rfl: 0  Review of Systems  Constitutional: Negative.   HENT: Negative.   Eyes: Negative.   Respiratory: Negative.   Cardiovascular: Negative.   Endocrine: Negative.   Genitourinary: Positive for frequency.  Neurological: Positive for headaches.       Stable .  Psychiatric/Behavioral: Positive for agitation and dysphoric mood.    Social History   Tobacco Use  . Smoking status: Never Smoker  . Smokeless tobacco: Never Used  Substance Use Topics  . Alcohol use: Yes    Comment: wine occasionally      Objective:   BP 133/77 (BP Location: Right Arm, Patient Position: Sitting, Cuff Size: Large)   Pulse 72   Temp (!) 97.2 F (36.2 C) (Other (Comment))   Resp 16   Ht 5' (1.524 m)   Wt 285 lb (129.3 kg)   SpO2 97%   BMI 55.66 kg/m  Vitals:   11/05/18 1500  BP: 133/77  Pulse: 72  Resp: 16  Temp: (!) 97.2 F (36.2 C)  TempSrc: Other (Comment)  SpO2: 97%  Weight: 285 lb (129.3 kg)  Height: 5' (1.524 m)  Body mass index is 55.66 kg/m.   Physical Exam Vitals signs reviewed.  Constitutional:      Appearance: She is well-developed. She is obese.     Comments: Morbidly obese female in no acute distress.  She is pleasant  and cooperative.  HENT:     Head: Normocephalic and atraumatic.     Right Ear: External ear normal.     Left Ear: External ear normal.     Nose: Nose normal.  Eyes:     General: No scleral icterus.    Conjunctiva/sclera: Conjunctivae normal.  Neck:     Thyroid: No thyromegaly.  Cardiovascular:     Rate and Rhythm: Normal rate and regular rhythm.     Heart sounds: Normal heart sounds.  Pulmonary:     Effort: Pulmonary effort is normal.     Breath sounds: Normal breath sounds.  Abdominal:     Palpations: Abdomen is soft.  Skin:    General: Skin is warm and dry.  Neurological:     General: No focal deficit present.     Mental Status: She is alert and oriented to person, place, and time.  Psychiatric:        Behavior: Behavior normal.         Thought Content: Thought content normal.        Judgment: Judgment normal.      No results found for any visits on 11/05/18.     Assessment & Plan    1. Major depressive disorder with current active episode, unspecified depression episode severity, unspecified whether recurrent  - CBC w/Diff/Platelet - TSH - Comprehensive Metabolic Panel (CMET)  2. Migraine without status migrainosus, not intractable, unspecified migraine type  - CBC w/Diff/Platelet - TSH - Comprehensive Metabolic Panel (CMET)  3. Need for influenza vaccination  - Flu Vaccine QUAD 36+ mos IM  4. Extreme obesity  - CBC w/Diff/Platelet - TSH - Comprehensive Metabolic Panel (CMET)  5. Allergic rhinitis, unspecified seasonality, unspecified trigger  - CBC w/Diff/Platelet - TSH - Comprehensive Metabolic Panel (CMET)  6. Urine frequency Check C and S. - POCT urinalysis dipstick - CULTURE, URINE COMPREHENSIVE      Follow up in January for depression.   Carolyn Walton Mon, MD  [facility]

## 2018-11-06 ENCOUNTER — Ambulatory Visit: Payer: 59 | Admitting: Family Medicine

## 2018-11-06 LAB — COMPREHENSIVE METABOLIC PANEL
ALT: 10 IU/L (ref 0–32)
AST: 17 IU/L (ref 0–40)
Albumin/Globulin Ratio: 1.8 (ref 1.2–2.2)
Albumin: 4.3 g/dL (ref 3.8–4.9)
Alkaline Phosphatase: 113 IU/L (ref 39–117)
BUN/Creatinine Ratio: 17 (ref 9–23)
BUN: 13 mg/dL (ref 6–24)
Bilirubin Total: 0.2 mg/dL (ref 0.0–1.2)
CO2: 21 mmol/L (ref 20–29)
Calcium: 9.1 mg/dL (ref 8.7–10.2)
Chloride: 109 mmol/L — ABNORMAL HIGH (ref 96–106)
Creatinine, Ser: 0.77 mg/dL (ref 0.57–1.00)
GFR calc Af Amer: 98 mL/min/{1.73_m2} (ref >59)
GFR calc non Af Amer: 85 mL/min/{1.73_m2} (ref >59)
Globulin, Total: 2.4 g/dL (ref 1.5–4.5)
Glucose: 95 mg/dL (ref 65–99)
Potassium: 4 mmol/L (ref 3.5–5.2)
Sodium: 141 mmol/L (ref 134–144)
Total Protein: 6.7 g/dL (ref 6.0–8.5)

## 2018-11-06 LAB — CBC WITH DIFFERENTIAL/PLATELET
Basophils Absolute: 0 10*3/uL (ref 0.0–0.2)
Basos: 0 %
EOS (ABSOLUTE): 0.2 10*3/uL (ref 0.0–0.4)
Eos: 2 %
Hematocrit: 40 % (ref 34.0–46.6)
Hemoglobin: 13.4 g/dL (ref 11.1–15.9)
Immature Grans (Abs): 0 10*3/uL (ref 0.0–0.1)
Immature Granulocytes: 1 %
Lymphocytes Absolute: 1.9 10*3/uL (ref 0.7–3.1)
Lymphs: 24 %
MCH: 28.6 pg (ref 26.6–33.0)
MCHC: 33.5 g/dL (ref 31.5–35.7)
MCV: 85 fL (ref 79–97)
Monocytes Absolute: 0.5 10*3/uL (ref 0.1–0.9)
Monocytes: 7 %
Neutrophils Absolute: 5.3 10*3/uL (ref 1.4–7.0)
Neutrophils: 66 %
Platelets: 189 10*3/uL (ref 150–450)
RBC: 4.69 x10E6/uL (ref 3.77–5.28)
RDW: 12.9 % (ref 11.7–15.4)
WBC: 8 10*3/uL (ref 3.4–10.8)

## 2018-11-06 LAB — TSH: TSH: 2.37 u[IU]/mL (ref 0.450–4.500)

## 2018-11-09 LAB — CULTURE, URINE COMPREHENSIVE

## 2018-11-11 ENCOUNTER — Telehealth: Payer: Self-pay

## 2018-11-11 DIAGNOSIS — F329 Major depressive disorder, single episode, unspecified: Secondary | ICD-10-CM

## 2018-11-11 DIAGNOSIS — R350 Frequency of micturition: Secondary | ICD-10-CM

## 2018-11-11 NOTE — Telephone Encounter (Signed)
The patient stated that she is still having the constant urge to urinate and has pressure while laying in the bed as well as during the day there is no change. She gets the urgent feeling of needing to urinate. She is requesting a referral to urology. I have also included her referral for her Depression as well.

## 2018-11-11 NOTE — Telephone Encounter (Signed)
-----   Message from Jerrol Banana., MD sent at 11/10/2018  2:32 PM EST ----- Labs OK

## 2018-12-03 ENCOUNTER — Encounter (HOSPITAL_COMMUNITY): Payer: Self-pay | Admitting: Psychology

## 2018-12-03 ENCOUNTER — Ambulatory Visit (INDEPENDENT_AMBULATORY_CARE_PROVIDER_SITE_OTHER): Payer: 59 | Admitting: Psychology

## 2018-12-03 ENCOUNTER — Other Ambulatory Visit: Payer: Self-pay

## 2018-12-03 DIAGNOSIS — F331 Major depressive disorder, recurrent, moderate: Secondary | ICD-10-CM | POA: Diagnosis not present
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
## 2018-12-18 ENCOUNTER — Encounter: Payer: Self-pay | Admitting: Urology

## 2018-12-18 ENCOUNTER — Ambulatory Visit: Payer: 59 | Admitting: Urology

## 2018-12-19 ENCOUNTER — Other Ambulatory Visit: Payer: Self-pay

## 2018-12-19 ENCOUNTER — Ambulatory Visit (INDEPENDENT_AMBULATORY_CARE_PROVIDER_SITE_OTHER): Payer: 59 | Admitting: Psychology

## 2018-12-19 ENCOUNTER — Other Ambulatory Visit: Payer: Self-pay | Admitting: Family Medicine

## 2018-12-19 DIAGNOSIS — G43909 Migraine, unspecified, not intractable, without status migrainosus: Secondary | ICD-10-CM

## 2018-12-19 DIAGNOSIS — F331 Major depressive disorder, recurrent, moderate: Secondary | ICD-10-CM | POA: Diagnosis not present

## 2018-12-19 DIAGNOSIS — F329 Major depressive disorder, single episode, unspecified: Secondary | ICD-10-CM

## 2018-12-19 NOTE — Telephone Encounter (Signed)
Requested medication (s) are due for refill today: yes  Requested medication (s) are on the active medication list: yes  Last refill:  11/04/2018  Future visit scheduled: yes  Notes to clinic:  refill cannot be delegated  Review for refill   Requested Prescriptions  Pending Prescriptions Disp Refills   topiramate (TOPAMAX) 200 MG tablet [Pharmacy Med Name: TOPIRAMATE 200 MG TABLET] 30 tablet 0    Sig: Take 1 tablet (200 mg total) by mouth daily.      Not Delegated - Neurology: Anticonvulsants - topiramate & zonisamide Failed - 12/19/2018  2:00 PM      Failed - This refill cannot be delegated      Passed - Cr in normal range and within 360 days    Creatinine  Date Value Ref Range Status  10/10/2013 0.70 0.60 - 1.30 mg/dL Final   Creatinine, Ser  Date Value Ref Range Status  11/05/2018 0.77 0.57 - 1.00 mg/dL Final          Passed - CO2 in normal range and within 360 days    CO2  Date Value Ref Range Status  11/05/2018 21 20 - 29 mmol/L Final   Co2  Date Value Ref Range Status  10/10/2013 28 21 - 32 mmol/L Final          Passed - Valid encounter within last 12 months    Recent Outpatient Visits           1 month ago Major depressive disorder with current active episode, unspecified depression episode severity, unspecified whether recurrent   [facility] Jerrol Banana., MD   1 year ago Major depressive disorder with single episode, in partial remission [facility])   [facility] Jerrol Banana., MD   1 year ago Major depressive disorder with current active episode, unspecified depression episode severity, unspecified whether recurrent   [facility] Jerrol Banana., MD   1 year ago Pulmonary nodule   [facility] Jerrol Banana., MD   1 year ago Chest pain, unspecified type   [facility] Jerrol Banana., MD       Future Appointments             In 3 weeks  Jerrol Banana., MD [facility]             Signed Prescriptions Disp Refills   sertraline (ZOLOFT) 50 MG tablet 30 tablet 0    Sig: Take 1 tablet (50 mg total) by mouth daily.      Psychiatry:  Antidepressants - SSRI Failed - 12/19/2018  2:00 PM      Failed - Completed PHQ-2 or PHQ-9 in the last 360 days.      Passed - Valid encounter within last 6 months    Recent Outpatient Visits           1 month ago Major depressive disorder with current active episode, unspecified depression episode severity, unspecified whether recurrent   [facility] Jerrol Banana., MD   1 year ago Major depressive disorder with single episode, in partial remission [facility])   [facility] Jerrol Banana., MD   1 year ago Major depressive disorder with current active episode, unspecified depression episode severity, unspecified whether recurrent   [facility] Jerrol Banana., MD   1 year ago Pulmonary nodule   [facility] Jerrol Banana., MD  1 year ago Chest pain, unspecified type   [facility] Maple Hudson., MD       Future Appointments             In 3 weeks Maple Hudson., MD [facility], [facility]

## 2018-12-19 NOTE — Progress Notes (Signed)
Virtual Visit via Video Note  I connected with Carolyn Walton on 12/19/18 at 12:30 PM EST by a video enabled telemedicine application and verified that I am speaking with the correct person using two identifiers.   I discussed the limitations of evaluation and management by telemedicine and the availability of in person appointments. The patient expressed understanding and agreed to proceed.    I discussed the assessment and treatment plan with the patient. The patient was provided an opportunity to ask questions and all were answered. The patient agreed with the plan and demonstrated an understanding of the instructions.   The patient was advised to call back or seek an in-person evaluation if the symptoms worsen or if the condition fails to improve as anticipated.  I provided 40 minutes of non-face-to-face time during this encounter.   [name] [facility]    THERAPIST PROGRESS NOTE  Session Time: 12.30pm-1.10pm  Participation Level: Active  Behavioral Response: Well GroomedAlertaffect wnl  Type of Therapy: Individual Therapy  Treatment Goals addressed: Diagnosis: MDD and goal 1.  Interventions: CBT and Strength-based  Summary: Carolyn Walton is a 58 y.o. female who presents with affect wnl.  pt is at work today and reports the day is going well. Pt reported that she had a break through 2 weeks ago.  Pt reported that she was able to express to husband how she was feeling and how he made the decision for ending their marriage w/out communicating to her.  Pt was able to express that she was upset w/ his actions and how played out.  Pt reported that felt good to express self and he was not used to as in past they would talk about things but she didn't feel she could openly express feelings to him based on his not accepting when did ealry in marriage. pt discussed that will likely go back to just texting now w/ husband as has said what needed.  Pt discusse her upcoming break from work  and vacation w/ children.  Pt is looking forward to this.   Suicidal/Homicidal: Nowithout intent/plan  Therapist Response: ASsessed pt current functioning per pt report.  Processed w/pt coping w/ separation and ability to express her emotions to husband.  Discussed continuing assertive communication.  Explored upcoming plans for holidays.    Plan: Return again in 2 weeks.via webex.  F/u as scheduled w/ PCP.   Diagnosis: MDD   [name] [facility] 12/19/2018

## 2019-01-06 ENCOUNTER — Other Ambulatory Visit: Payer: Self-pay

## 2019-01-06 ENCOUNTER — Ambulatory Visit (HOSPITAL_COMMUNITY): Payer: 59 | Admitting: Psychology

## 2019-01-06 ENCOUNTER — Ambulatory Visit: Payer: 59 | Admitting: Family Medicine

## 2019-01-06 ENCOUNTER — Encounter (HOSPITAL_COMMUNITY): Payer: Self-pay | Admitting: Psychology

## 2019-01-06 NOTE — Progress Notes (Unsigned)
OSHA RANE is a 58 y.o. female patient who didn't show for her virtual counseling appointment.  Pt informed by email.  Pt already has appointment for f/u on 01/21/19.        Forde Radon, [facility]

## 2019-01-09 ENCOUNTER — Other Ambulatory Visit: Payer: Self-pay

## 2019-01-09 ENCOUNTER — Encounter: Payer: Self-pay | Admitting: Family Medicine

## 2019-01-09 ENCOUNTER — Ambulatory Visit (INDEPENDENT_AMBULATORY_CARE_PROVIDER_SITE_OTHER): Payer: 59 | Admitting: Family Medicine

## 2019-01-09 VITALS — BP 138/76 | HR 73 | Temp 96.9°F | Resp 16 | Ht 60.0 in | Wt 279.0 lb

## 2019-01-09 DIAGNOSIS — Z111 Encounter for screening for respiratory tuberculosis: Secondary | ICD-10-CM

## 2019-01-09 DIAGNOSIS — Z0184 Encounter for antibody response examination: Secondary | ICD-10-CM | POA: Diagnosis not present

## 2019-01-09 DIAGNOSIS — F329 Major depressive disorder, single episode, unspecified: Secondary | ICD-10-CM

## 2019-01-09 NOTE — Progress Notes (Signed)
Patient: Carolyn Walton Female    DOB: [birth year]   59 y.o.   MRN: [id number] Visit Date: 01/09/2019  Today's Provider: Megan Mans, MD   Chief Complaint  Patient presents with  . Follow-up  . Depression   Subjective:     HPI   Major depressive disorder with current active episode, unspecified depression episode severity, unspecified whether recurrent From 11/05/2018-no changes. Pt is much improved emotionally from last visit. 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 She is excited about this.  Allergies  Allergen Reactions  . Molds & Smuts Other (See Comments)    Sneezing  . Other Other (See Comments)    CATS AND DOGS     Current Outpatient Medications:  .  albuterol (VENTOLIN HFA) 108 (90 Base) MCG/ACT inhaler, Inhale 2 puffs into the lungs every 4 (four) hours as needed for wheezing or shortness of breath., Disp: 18 g, Rfl: 12 .  cetirizine (ZYRTEC) 10 MG tablet, Take 10 mg by mouth daily., Disp: , Rfl:  .  citalopram (CELEXA) 20 MG tablet, , Disp: , Rfl:  .  Fluticasone-Salmeterol (ADVAIR) 250-50 MCG/DOSE AEPB, Inhale 1 puff into the lungs daily., Disp: 180 each, Rfl: 3 .  montelukast (SINGULAIR) 10 MG tablet, TAKE ONE TABLET AT BED- TIME., Disp: 30 tablet, Rfl: 3 .  sertraline (ZOLOFT) 100 MG tablet, Take 1 tablet (100 mg total) by mouth daily., Disp: 30 tablet, Rfl: 1 .  SUMAtriptan (IMITREX) 100 MG tablet, Take 1 tablet (100 mg total) by mouth daily. May repeat in 2 hours if headache persists or recurs., Disp: 9 tablet, Rfl: 3 .  topiramate (TOPAMAX) 200 MG tablet, Take 1 tablet (200 mg total) by mouth daily., Disp: 30 tablet, Rfl: 5 .  doxylamine, Sleep, (UNISOM) 25 MG tablet, Take 25 mg by mouth at bedtime as needed. Reported on 03/20/2015, Disp: , Rfl:  .  sertraline (ZOLOFT) 50 MG tablet, Take 1 tablet (50 mg total) by mouth daily. (Patient not taking: Reported on 01/09/2019), Disp: 30 tablet, Rfl: 0  Review of Systems   Constitutional: Negative for appetite change, chills, fatigue and fever.  Eyes: Negative.   Respiratory: Negative for chest tightness and shortness of breath.   Cardiovascular: Negative for chest pain and palpitations.  Gastrointestinal: Negative for abdominal pain, nausea and vomiting.  Endocrine: Negative.   Allergic/Immunologic: Negative.   Neurological: Negative for dizziness and weakness.  Psychiatric/Behavioral: Negative.     Social History   Tobacco Use  . Smoking status: Never Smoker  . Smokeless tobacco: Never Used  Substance Use Topics  . Alcohol use: Yes    Comment: wine occasionally      Objective:   BP 138/76 (BP Location: Left Arm, Patient Position: Sitting, Cuff Size: Large)   Pulse 73   Temp (!) 96.9 F (36.1 C) (Other (Comment))   Resp 16   Ht 5' (1.524 m)   Wt 279 lb (126.6 kg)   SpO2 97%   BMI 54.49 kg/m  Vitals:   01/09/19 1509  BP: 138/76  Pulse: 73  Resp: 16  Temp: (!) 96.9 F (36.1 C)  TempSrc: Other (Comment)  SpO2: 97%  Weight: 279 lb (126.6 kg)  Height: 5' (1.524 m)  Body mass index is 54.49 kg/m.   Physical Exam Vitals reviewed.  Constitutional:      Appearance: She is well-developed.     Comments: Morbidly obese female in no acute distress.  She is pleasant and  cooperative.  HENT:     Head: Normocephalic and atraumatic.     Right Ear: External ear normal.     Left Ear: External ear normal.     Nose: Nose normal.  Eyes:     General: No scleral icterus.    Conjunctiva/sclera: Conjunctivae normal.  Neck:     Thyroid: No thyromegaly.  Cardiovascular:     Rate and Rhythm: Normal rate and regular rhythm.     Heart sounds: Normal heart sounds.  Pulmonary:     Effort: Pulmonary effort is normal.     Breath sounds: Normal breath sounds.  Abdominal:     Palpations: Abdomen is soft.  Skin:    General: Skin is warm and dry.  Neurological:     Mental Status: She is alert and oriented to person, place, and time.  Psychiatric:         Behavior: Behavior normal.        Thought Content: Thought content normal.        Judgment: Judgment normal.      No results found for any visits on 01/09/19.     Assessment & Plan    1. Major depressive disorder with current active episode, unspecified depression episode severity, unspecified whether recurrent Improved--situational worse with separation. Pt advised to continue counseling.In remission. Form filled out for ABSS.More than 50% 25 minute visit spent in counseling or coordination of care   2. Screening-pulmonary TB  - QuantiFERON-TB Gold Plus  3. Immunity to measles, mumps, and rubella determined by serologic test  - Measles/Mumps/Rubella Immunity     I,Kabrea Seeney,acting as a scribe for Wilhemena Durie, MD.,have documented all relevant documentation on the behalf of Wilhemena Durie, MD,as directed by  Wilhemena Durie, MD while in the presence of Wilhemena Durie, MD.      Wilhemena Durie, MD  [employer]

## 2019-01-12 LAB — QUANTIFERON-TB GOLD PLUS
QuantiFERON Mitogen Value: >10 IU/mL
QuantiFERON Nil Value: 0.04 IU/mL
QuantiFERON TB1 Ag Value: 0.04 IU/mL
QuantiFERON TB2 Ag Value: 0.04 IU/mL
QuantiFERON-TB Gold Plus: NEGATIVE

## 2019-01-12 LAB — MEASLES/MUMPS/RUBELLA IMMUNITY
MUMPS ABS, IGG: 40.1 AU/mL (ref >10.9)
RUBEOLA AB, IGG: >300 AU/mL (ref >16.4)
Rubella Antibodies, IGG: <0.9 index — ABNORMAL LOW (ref >0.99)

## 2019-01-13 ENCOUNTER — Telehealth: Payer: Self-pay | Admitting: *Deleted

## 2019-01-13 ENCOUNTER — Other Ambulatory Visit: Payer: Self-pay | Admitting: Family Medicine

## 2019-01-13 DIAGNOSIS — F329 Major depressive disorder, single episode, unspecified: Secondary | ICD-10-CM

## 2019-01-13 NOTE — Telephone Encounter (Signed)
No answer and no vm. If patient decides to update her MMR vaccine, she will need to go to [facility]. We do not have MMR vaccine in our office any longer.

## 2019-01-13 NOTE — Telephone Encounter (Signed)
-----  Message from Jerrol Banana., MD sent at 01/13/2019 10:10 AM EST ----- Labs ok--might need to update MMR for rebella titer.

## 2019-01-21 ENCOUNTER — Ambulatory Visit (INDEPENDENT_AMBULATORY_CARE_PROVIDER_SITE_OTHER): Payer: 59 | Admitting: Psychology

## 2019-01-21 ENCOUNTER — Other Ambulatory Visit: Payer: Self-pay

## 2019-01-21 DIAGNOSIS — F330 Major depressive disorder, recurrent, mild: Secondary | ICD-10-CM | POA: Diagnosis not present

## 2019-01-21 NOTE — Progress Notes (Signed)
Virtual Visit via Video Note  I connected with Carolyn Walton on 01/21/19 at 12:30 PM EST by a video enabled telemedicine application and verified that I am speaking with the correct person using two identifiers.   I discussed the limitations of evaluation and management by telemedicine and the availability of in person appointments. The patient expressed understanding and agreed to proceed.    I discussed the assessment and treatment plan with the patient. The patient was provided an opportunity to ask questions and all were answered. The patient agreed with the plan and demonstrated an understanding of the instructions.   The patient was advised to call back or seek an in-person evaluation if the symptoms worsen or if the condition fails to improve as anticipated.  I provided 32 minutes of non-face-to-face time during this encounter.   Forde Radon [facility]    THERAPIST PROGRESS NOTE  Session Time: 12.50pm-1.12pm  Participation Level: Active  Behavioral Response: Well GroomedAlertaffect wnl  Type of Therapy: Individual Therapy  Treatment Goals addressed: Diagnosis: MDD and goal 1.  Interventions: CBT and Strength-based  Summary: Carolyn Walton is a 59 y.o. female who presents with affect bright.  Pt was late logging in and requested link be sent to new email address so could access on computer.  Pt reported that she continues to be doing well w/her mood improved.  Pt reports that she has been able to move forward since expressing all she needed to her ex in December.  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  Pt reported it was difficult leaving her kids she worked w/ at [employer] and connections there- but had good support from her  supervisor as well to make move.  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  Pt discussed that she is looking forward to celebrating son's birthday.  Pt reported that had conversation w/ ex and was ok and able to keep healthy boundaries.    Suicidal/Homicidal: Nowithout intent/plan  Therapist Response: Assessed pt current functioning per pt report. Processed w/pt coping w/ mood, separation, transition to new job.  Assisted pt in recognize strengths and supports she has and continued moving forward.   Plan: Return again in 3 weeks, via webex.  Diagnosis: MDD   Forde Radon [facility] 01/21/2019

## 2019-02-17 ENCOUNTER — Other Ambulatory Visit: Payer: Self-pay

## 2019-02-17 ENCOUNTER — Encounter (HOSPITAL_COMMUNITY): Payer: Self-pay | Admitting: Psychology

## 2019-02-17 ENCOUNTER — Ambulatory Visit (HOSPITAL_COMMUNITY): Payer: Self-pay | Admitting: Psychology

## 2019-02-17 NOTE — Progress Notes (Signed)
Carolyn Walton is a 59 y.o. female patient who didn't show for her virtual appointment.  Counselor informed pt of missed appointment by email.         Forde Radon, [facility]

## 2019-03-02 ENCOUNTER — Ambulatory Visit: Payer: BC Managed Care – PPO | Attending: Internal Medicine

## 2019-03-02 DIAGNOSIS — Z23 Encounter for immunization: Secondary | ICD-10-CM | POA: Insufficient documentation

## 2019-03-02 NOTE — Progress Notes (Signed)
   Covid-19 Vaccination Clinic  Name:  Carolyn Walton    MRN: [id number] DOB: 27-May-1960  03/02/2019  [name] was observed post Covid-19 immunization for 15 minutes without incidence. She was provided with Vaccine Information Sheet and instruction to access the V-Safe system.   [name] was instructed to call 911 with any severe reactions post vaccine: Marland Kitchen Difficulty breathing  . Swelling of your face and throat  . A fast heartbeat  . A bad rash all over your body  . Dizziness and weakness    Immunizations Administered    Name Date Dose VIS Date Route   Pfizer COVID-19 Vaccine 03/02/2019 10:49 AM 0.3 mL 12/13/2018 Intramuscular   Manufacturer: [employer], [employer]   Lot: [id number]   NDC: [address]-4

## 2019-03-20 ENCOUNTER — Other Ambulatory Visit: Payer: Self-pay | Admitting: Family Medicine

## 2019-03-20 DIAGNOSIS — F329 Major depressive disorder, single episode, unspecified: Secondary | ICD-10-CM

## 2019-03-25 ENCOUNTER — Ambulatory Visit: Payer: BC Managed Care – PPO | Attending: Internal Medicine

## 2019-03-25 DIAGNOSIS — Z23 Encounter for immunization: Secondary | ICD-10-CM

## 2019-03-25 NOTE — Progress Notes (Signed)
   Covid-19 Vaccination Clinic  Name:  Carolyn Walton    MRN: [id number] DOB: [birth year]  03/25/2019  Carolyn Walton was observed post Covid-19 immunization for 15 minutes without incident. She was provided with Vaccine Information Sheet and instruction to access the V-Safe system.   Carolyn Walton was instructed to call 911 with any severe reactions post vaccine: Marland Kitchen Difficulty breathing  . Swelling of face and throat  . A fast heartbeat  . A bad rash all over body  . Dizziness and weakness   Immunizations Administered    Name Date Dose VIS Date Route   Pfizer COVID-19 Vaccine 03/25/2019  8:40 AM 0.3 mL 12/13/2018 Intramuscular   Manufacturer: [employer], [employer]   Lot: [id number]   NDC: [address]-2

## 2019-05-12 ENCOUNTER — Encounter (HOSPITAL_COMMUNITY): Payer: Self-pay | Admitting: Psychology

## 2019-05-21 ENCOUNTER — Encounter (HOSPITAL_COMMUNITY): Payer: Self-pay | Admitting: Psychology

## 2019-05-21 NOTE — Progress Notes (Signed)
Carolyn Walton is a 59 y.o. female patient who is discharged from counseling as last seen over 90 days ago.  Counselor informed pt of upcoming departure from practice and no response received.  Pt may return if needed for counseling.        Forde Radon, [facility]

## 2019-06-04 ENCOUNTER — Other Ambulatory Visit: Payer: Self-pay | Admitting: Family Medicine

## 2019-06-04 DIAGNOSIS — F329 Major depressive disorder, single episode, unspecified: Secondary | ICD-10-CM

## 2019-06-04 NOTE — Telephone Encounter (Signed)
[facility] asking for refills on Sertraline 100 mg.

## 2019-06-05 MED ORDER — SERTRALINE HCL 50 MG PO TABS: 50.0000 mg | ORAL_TABLET | Freq: Every day | ORAL | 5 refills | Status: DC

## 2019-07-01 ENCOUNTER — Other Ambulatory Visit: Payer: Self-pay | Admitting: Family Medicine

## 2019-07-01 DIAGNOSIS — G43909 Migraine, unspecified, not intractable, without status migrainosus: Secondary | ICD-10-CM

## 2019-07-01 DIAGNOSIS — J4520 Mild intermittent asthma, uncomplicated: Secondary | ICD-10-CM

## 2019-07-01 NOTE — Telephone Encounter (Signed)
Requested Prescriptions  Pending Prescriptions Disp Refills  . montelukast (SINGULAIR) 10 MG tablet [Pharmacy Med Name: MONTELUKAST SOD 10 MG TABLET] 90 tablet 0    Sig: TAKE ONE TABLET AT BED- TIME.     Pulmonology:  Leukotriene Inhibitors Passed - 07/01/2019 11:35 AM      Passed - Valid encounter within last 12 months    Recent Outpatient Visits          5 months ago Major depressive disorder with current active episode, unspecified depression episode severity, unspecified whether recurrent   [facility] Maple Hudson., MD   7 months ago Major depressive disorder with current active episode, unspecified depression episode severity, unspecified whether recurrent   [facility] Maple Hudson., MD   1 year ago Major depressive disorder with single episode, in partial remission [facility])   [facility] Maple Hudson., MD   1 year ago Major depressive disorder with current active episode, unspecified depression episode severity, unspecified whether recurrent   [facility] Maple Hudson., MD   1 year ago Pulmonary nodule   [facility] Maple Hudson., MD

## 2019-07-01 NOTE — Telephone Encounter (Signed)
Requested medication (s) are due for refill today: yes  Requested medication (s) are on the active medication list: yes  Last refill:  06/04/19  Future visit scheduled: no  Notes to clinic:  not delegated   Requested Prescriptions  Pending Prescriptions Disp Refills   topiramate (TOPAMAX) 200 MG tablet [Pharmacy Med Name: TOPIRAMATE 200 MG TABLET] 30 tablet 0    Sig: Take 1 tablet (200 mg total) by mouth daily.      Not Delegated - Neurology: Anticonvulsants - topiramate & zonisamide Failed - 07/01/2019 11:39 AM      Failed - This refill cannot be delegated      Passed - Cr in normal range and within 360 days    Creatinine  Date Value Ref Range Status  10/10/2013 0.70 0.60 - 1.30 mg/dL Final   Creatinine, Ser  Date Value Ref Range Status  11/05/2018 0.77 0.57 - 1.00 mg/dL Final          Passed - CO2 in normal range and within 360 days    CO2  Date Value Ref Range Status  11/05/2018 21 20 - 29 mmol/L Final   Co2  Date Value Ref Range Status  10/10/2013 28 21 - 32 mmol/L Final          Passed - Valid encounter within last 12 months    Recent Outpatient Visits           5 months ago Major depressive disorder with current active episode, unspecified depression episode severity, unspecified whether recurrent   [facility] Maple Hudson., MD   7 months ago Major depressive disorder with current active episode, unspecified depression episode severity, unspecified whether recurrent   [facility] Maple Hudson., MD   1 year ago Major depressive disorder with single episode, in partial remission [facility])   [facility] Maple Hudson., MD   1 year ago Major depressive disorder with current active episode, unspecified depression episode severity, unspecified whether recurrent   [facility] Maple Hudson., MD   1 year ago Pulmonary nodule   [facility] Maple Hudson.,  MD

## 2019-07-28 ENCOUNTER — Other Ambulatory Visit: Payer: Self-pay | Admitting: Family Medicine

## 2019-07-28 DIAGNOSIS — G43909 Migraine, unspecified, not intractable, without status migrainosus: Secondary | ICD-10-CM

## 2019-07-28 NOTE — Telephone Encounter (Signed)
Requested medication (s) are due for refill today: Due 07/31/2019  Requested medication (s) are on the active medication list: yes  Last refill: 07/01/19  # 30    0 refills  Future visit scheduled No  Notes to clinic: Not delegated  Requested Prescriptions  Pending Prescriptions Disp Refills   topiramate (TOPAMAX) 200 MG tablet [Pharmacy Med Name: TOPIRAMATE 200 MG TABLET] 30 tablet 0    Sig: Take 1 tablet (200 mg total) by mouth daily.      Not Delegated - Neurology: Anticonvulsants - topiramate & zonisamide Failed - 07/28/2019  3:52 PM      Failed - This refill cannot be delegated      Passed - Cr in normal range and within 360 days    Creatinine  Date Value Ref Range Status  10/10/2013 0.70 0.60 - 1.30 mg/dL Final   Creatinine, Ser  Date Value Ref Range Status  11/05/2018 0.77 0.57 - 1.00 mg/dL Final          Passed - CO2 in normal range and within 360 days    CO2  Date Value Ref Range Status  11/05/2018 21 20 - 29 mmol/L Final   Co2  Date Value Ref Range Status  10/10/2013 28 21 - 32 mmol/L Final          Passed - Valid encounter within last 12 months    Recent Outpatient Visits           6 months ago Major depressive disorder with current active episode, unspecified depression episode severity, unspecified whether recurrent   [facility] Maple Hudson., MD   8 months ago Major depressive disorder with current active episode, unspecified depression episode severity, unspecified whether recurrent   [facility] Maple Hudson., MD   1 year ago Major depressive disorder with single episode, in partial remission [facility])   [facility] Maple Hudson., MD   1 year ago Major depressive disorder with current active episode, unspecified depression episode severity, unspecified whether recurrent   [facility] Maple Hudson., MD   2 years ago Pulmonary nodule   [facility] Maple Hudson., MD

## 2019-08-25 ENCOUNTER — Other Ambulatory Visit: Payer: Self-pay | Admitting: Family Medicine

## 2019-08-25 DIAGNOSIS — G43909 Migraine, unspecified, not intractable, without status migrainosus: Secondary | ICD-10-CM

## 2019-08-25 NOTE — Telephone Encounter (Signed)
Requested medication (s) are due for refill today:Due 08/29/19  Requested medication (s) are on the active medication list: yes   Last refill: 07/29/19  #30  0 refills  Future visit scheduled  no  Notes to clinic:not delegated  Requested Prescriptions  Pending Prescriptions Disp Refills   topiramate (TOPAMAX) 200 MG tablet [Pharmacy Med Name: TOPIRAMATE 200 MG TABLET] 30 tablet 0    Sig: Take 1 tablet (200 mg total) by mouth daily.      Not Delegated - Neurology: Anticonvulsants - topiramate & zonisamide Failed - 08/25/2019  2:49 PM      Failed - This refill cannot be delegated      Passed - Cr in normal range and within 360 days    Creatinine  Date Value Ref Range Status  10/10/2013 0.70 0.60 - 1.30 mg/dL Final   Creatinine, Ser  Date Value Ref Range Status  11/05/2018 0.77 0.57 - 1.00 mg/dL Final          Passed - CO2 in normal range and within 360 days    CO2  Date Value Ref Range Status  11/05/2018 21 20 - 29 mmol/L Final   Co2  Date Value Ref Range Status  10/10/2013 28 21 - 32 mmol/L Final          Passed - Valid encounter within last 12 months    Recent Outpatient Visits           7 months ago Major depressive disorder with current active episode, unspecified depression episode severity, unspecified whether recurrent   [facility] Maple Hudson., MD   9 months ago Major depressive disorder with current active episode, unspecified depression episode severity, unspecified whether recurrent   [facility] Maple Hudson., MD   1 year ago Major depressive disorder with single episode, in partial remission [facility])   [facility] Maple Hudson., MD   1 year ago Major depressive disorder with current active episode, unspecified depression episode severity, unspecified whether recurrent   [facility] Maple Hudson., MD   2 years ago Pulmonary nodule   [facility]  Maple Hudson., MD

## 2019-09-10 ENCOUNTER — Encounter: Payer: Self-pay | Admitting: Family Medicine

## 2019-09-10 ENCOUNTER — Ambulatory Visit: Payer: BC Managed Care – PPO | Admitting: Family Medicine

## 2019-09-10 ENCOUNTER — Other Ambulatory Visit: Payer: Self-pay

## 2019-09-10 VITALS — BP 142/93 | HR 73 | Temp 97.9°F | Resp 16 | Ht 60.0 in | Wt 256.0 lb

## 2019-09-10 DIAGNOSIS — E668 Other obesity: Secondary | ICD-10-CM | POA: Diagnosis not present

## 2019-09-10 DIAGNOSIS — G43909 Migraine, unspecified, not intractable, without status migrainosus: Secondary | ICD-10-CM | POA: Diagnosis not present

## 2019-09-10 DIAGNOSIS — J309 Allergic rhinitis, unspecified: Secondary | ICD-10-CM | POA: Diagnosis not present

## 2019-09-10 DIAGNOSIS — F329 Major depressive disorder, single episode, unspecified: Secondary | ICD-10-CM

## 2019-09-10 MED ORDER — BUPROPION HCL ER (XL) 150 MG PO TB24: 150.0000 mg | ORAL_TABLET | Freq: Every day | ORAL | 2 refills | Status: DC

## 2019-09-10 NOTE — Patient Instructions (Signed)
Take Nurtec 75 mg once daily as needed for migraines.

## 2019-09-10 NOTE — Progress Notes (Signed)
I,[name],acting as a scribe for Megan Mans, MD.,have documented all relevant documentation on the behalf of Megan Mans, MD,as directed by  Megan Mans, MD while in the presence of Megan Mans, MD.   Established patient visit   Patient: Carolyn Walton   DOB: 1960/11/23   59 y.o. Female  MRN: [id number] Visit Date: 09/10/2019  Today's healthcare provider: Megan Mans, MD   Chief Complaint  Patient presents with  . Depression   Subjective    HPI  Patient is brought in by her daughter due to depressive symptoms since she has been separated from her husband. She has no suicidal ideation but feels that she might be better off if she were dead.  She says she has 0 plans to harm her self or promises she will not do so.  She has also promised her daughter this in the past.  She has had no attempts to harm herself. She scores a 19 on the PHQ-9.      Medications: Outpatient Medications Prior to Visit  Medication Sig  . albuterol (VENTOLIN HFA) 108 (90 Base) MCG/ACT inhaler Inhale 2 puffs into the lungs every 4 (four) hours as needed for wheezing or shortness of breath.  . citalopram (CELEXA) 20 MG tablet   . doxylamine, Sleep, (UNISOM) 25 MG tablet Take 25 mg by mouth at bedtime as needed. Reported on 03/20/2015  . Fluticasone-Salmeterol (ADVAIR) 250-50 MCG/DOSE AEPB Inhale 1 puff into the lungs daily.  . montelukast (SINGULAIR) 10 MG tablet TAKE ONE TABLET AT BED- TIME.  Marland Kitchen sertraline (ZOLOFT) 100 MG tablet Take 1 tablet (100 mg total) by mouth daily.  . SUMAtriptan (IMITREX) 100 MG tablet Take 1 tablet (100 mg total) by mouth daily. May repeat in 2 hours if headache persists or recurs.  . topiramate (TOPAMAX) 200 MG tablet Take 1 tablet (200 mg total) by mouth daily.  . cetirizine (ZYRTEC) 10 MG tablet Take 10 mg by mouth daily. (Patient not taking: Reported on 09/10/2019)  . sertraline (ZOLOFT) 50 MG tablet Take 1 tablet (50 mg total) by mouth  daily. (Patient not taking: Reported on 09/10/2019)   No facility-administered medications prior to visit.    Review of Systems  Constitutional: Negative for appetite change, chills, fatigue and fever.  Respiratory: Negative for chest tightness and shortness of breath.   Cardiovascular: Negative for chest pain and palpitations.  Gastrointestinal: Negative for abdominal pain, nausea and vomiting.  Neurological: Negative for dizziness and weakness.       Objective    BP (!) 142/93 (BP Location: Right Arm, Patient Position: Sitting, Cuff Size: Large)   Pulse 73   Temp 97.9 F (36.6 C) (Oral)   Resp 16   Ht 5' (1.524 m)   Wt 256 lb (116.1 kg)   SpO2 97%   BMI 50.00 kg/m  BP Readings from Last 3 Encounters:  09/10/19 (!) 142/93  01/09/19 138/76  11/05/18 133/77   Wt Readings from Last 3 Encounters:  09/10/19 256 lb (116.1 kg)  01/09/19 279 lb (126.6 kg)  11/05/18 285 lb (129.3 kg)      Physical Exam Vitals reviewed.  Constitutional:      Appearance: She is well-developed.     Comments: Morbidly obese female in no acute distress.  She is pleasant and cooperative.  HENT:     Head: Normocephalic and atraumatic.     Right Ear: External ear normal.     Left Ear: External ear normal.  Nose: Nose normal.  Eyes:     General: No scleral icterus.    Conjunctiva/sclera: Conjunctivae normal.  Neck:     Thyroid: No thyromegaly.  Cardiovascular:     Rate and Rhythm: Normal rate and regular rhythm.     Heart sounds: Normal heart sounds.  Pulmonary:     Effort: Pulmonary effort is normal.     Breath sounds: Normal breath sounds.  Abdominal:     Palpations: Abdomen is soft.  Skin:    General: Skin is warm and dry.  Neurological:     General: No focal deficit present.     Mental Status: She is alert and oriented to person, place, and time.  Psychiatric:        Behavior: Behavior normal.        Thought Content: Thought content normal.        Judgment: Judgment normal.        No results found for any visits on 09/10/19.  Assessment & Plan     1. Major depressive disorder with current active episode, unspecified depression episode severity, unspecified whether recurrent Refer to psychiatry for evaluation and referral for further counseling.More than 50% 25 minute visit spent in counseling or coordination of care  - buPROPion (WELLBUTRIN XL) 150 MG 24 hr tablet; Take 1 tablet (150 mg total) by mouth daily.  Dispense: 30 tablet; Refill: 2 - Ambulatory referral to Psychiatry  2. Allergic rhinitis, unspecified seasonality, unspecified trigger   3. Migraine without status migrainosus, not intractable, unspecified migraine type Patient is given some samples of Nurtec to try as needed.  4. Extreme obesity This is been discussed through the years.  Lifestyle stressed.   No follow-ups on file.      I, Megan Mans, MD, have reviewed all documentation for this visit. The documentation on 09/19/19 for the exam, diagnosis, procedures, and orders are all accurate and complete.    Davidlee Jeanbaptiste Wendelyn Breslow, MD  [facility] [phone] (phone) [phone] (fax)  [facility]

## 2019-09-22 ENCOUNTER — Telehealth: Payer: Self-pay

## 2019-09-22 NOTE — Telephone Encounter (Signed)
Returned patients call, unable to leave voicemail.

## 2019-09-22 NOTE — Telephone Encounter (Signed)
Copied from CRM [id number]. Topic: General - Other >> Sep 22, 2019  2:43 PM Dalphine Handing A wrote: Patient would like a callback from [facility] nurse today.

## 2019-09-25 ENCOUNTER — Telehealth: Payer: Self-pay | Admitting: Family Medicine

## 2019-09-25 NOTE — Telephone Encounter (Signed)
Patient is calling to state that Dr. Maryruth Bun can not see her until 11/03/19. Patient is wanting to know can Dr. Sullivan Lone get her a sooner appt or can another referral be placed for another doctor. Please advise CB- [phone]

## 2019-09-26 ENCOUNTER — Other Ambulatory Visit: Payer: Self-pay | Admitting: Family Medicine

## 2019-09-26 DIAGNOSIS — J4520 Mild intermittent asthma, uncomplicated: Secondary | ICD-10-CM

## 2019-09-26 NOTE — Telephone Encounter (Signed)
Requested Prescriptions  Pending Prescriptions Disp Refills   montelukast (SINGULAIR) 10 MG tablet [Pharmacy Med Name: MONTELUKAST SOD 10 MG TABLET] 90 tablet 0    Sig: TAKE ONE TABLET AT BED- TIME.     Pulmonology:  Leukotriene Inhibitors Passed - 09/26/2019  1:08 PM      Passed - Valid encounter within last 12 months    Recent Outpatient Visits          2 weeks ago Major depressive disorder with current active episode, unspecified depression episode severity, unspecified whether recurrent   [facility] Maple Hudson., MD   8 months ago Major depressive disorder with current active episode, unspecified depression episode severity, unspecified whether recurrent   [facility] Maple Hudson., MD   10 months ago Major depressive disorder with current active episode, unspecified depression episode severity, unspecified whether recurrent   [facility] Maple Hudson., MD   1 year ago Major depressive disorder with single episode, in partial remission [facility])   [facility] Maple Hudson., MD   2 years ago Major depressive disorder with current active episode, unspecified depression episode severity, unspecified whether recurrent   [facility] Maple Hudson., MD      Future Appointments            In 2 months Maple Hudson., MD [facility]

## 2019-09-26 NOTE — Telephone Encounter (Signed)
LMOVM for pt to return call 

## 2019-09-29 NOTE — Telephone Encounter (Signed)
Okay to try referral to different psychiatrist locally or in [place].  Thank you

## 2019-09-30 NOTE — Telephone Encounter (Signed)
Please advise 

## 2019-11-25 ENCOUNTER — Telehealth: Payer: Self-pay | Admitting: Family Medicine

## 2019-11-25 DIAGNOSIS — F329 Major depressive disorder, single episode, unspecified: Secondary | ICD-10-CM

## 2019-11-25 NOTE — Telephone Encounter (Signed)
Patient called to request a courtesy refill for her medication for buPROPion (WELLBUTRIN XL) 150 MG 24 hr tablet.  She stated she tried to get an appt. For the Psych. And it won't be until February 2022.  Please advise and call patient to let her know.  CB# [phone]

## 2019-11-26 MED ORDER — BUPROPION HCL ER (XL) 150 MG PO TB24: 150.0000 mg | ORAL_TABLET | Freq: Every day | ORAL | 0 refills | Status: DC

## 2019-11-26 NOTE — Telephone Encounter (Signed)
Since Dr. Sullivan Lone is out of the office, I will give 30 days and he can re-evaluate.

## 2019-11-26 NOTE — Telephone Encounter (Signed)
Please advise 

## 2019-12-09 ENCOUNTER — Other Ambulatory Visit: Payer: Self-pay | Admitting: Family Medicine

## 2019-12-09 DIAGNOSIS — G43909 Migraine, unspecified, not intractable, without status migrainosus: Secondary | ICD-10-CM

## 2019-12-09 DIAGNOSIS — F329 Major depressive disorder, single episode, unspecified: Secondary | ICD-10-CM

## 2019-12-09 NOTE — Telephone Encounter (Signed)
Requested medication (s) are due for refill today  Provider to determine  Requested medication (s) are on the active medication list:   Yes  Future visit scheduled:   Yes tomorrow (12/8)   Last ordered: 08/26/2019 #30, 2 refills  Non delegated refill   Requested Prescriptions  Pending Prescriptions Disp Refills   topiramate (TOPAMAX) 200 MG tablet [Pharmacy Med Name: TOPIRAMATE 200 MG TABLET] 30 tablet 0    Sig: Take 1 tablet (200 mg total) by mouth daily.      Not Delegated - Neurology: Anticonvulsants - topiramate & zonisamide Failed - 12/09/2019 10:35 AM      Failed - This refill cannot be delegated      Failed - Cr in normal range and within 360 days    Creatinine  Date Value Ref Range Status  10/10/2013 0.70 0.60 - 1.30 mg/dL Final   Creatinine, Ser  Date Value Ref Range Status  11/05/2018 0.77 0.57 - 1.00 mg/dL Final          Failed - CO2 in normal range and within 360 days    CO2  Date Value Ref Range Status  11/05/2018 21 20 - 29 mmol/L Final   Co2  Date Value Ref Range Status  10/10/2013 28 21 - 32 mmol/L Final          Passed - Valid encounter within last 12 months    Recent Outpatient Visits           3 months ago Major depressive disorder with current active episode, unspecified depression episode severity, unspecified whether recurrent   [facility] Maple Hudson., MD   11 months ago Major depressive disorder with current active episode, unspecified depression episode severity, unspecified whether recurrent   [facility] Maple Hudson., MD   1 year ago Major depressive disorder with current active episode, unspecified depression episode severity, unspecified whether recurrent   [facility] Maple Hudson., MD   2 years ago Major depressive disorder with single episode, in partial remission [facility])   [facility] Maple Hudson., MD   2 years ago Major depressive  disorder with current active episode, unspecified depression episode severity, unspecified whether recurrent   [facility] Maple Hudson., MD       Future Appointments             Tomorrow Maple Hudson., MD [facility]

## 2019-12-10 ENCOUNTER — Ambulatory Visit: Payer: Self-pay | Admitting: Family Medicine

## 2019-12-10 NOTE — Progress Notes (Deleted)
Established patient visit   Patient: Carolyn Walton   DOB: 1960-08-14   59 y.o. Female  MRN: [id number] Visit Date: 12/10/2019  Today's healthcare provider: Megan Mans, MD   No chief complaint on file.  Subjective    HPI  Depression, Follow-up  She  was last seen for this 3 months ago. Changes made at last visit include; Referred to psychiatry for evaluation and referral for further counseling.   She reports {excellent/good/fair/poor:19665} compliance with treatment. She {is/is not:21021397} having side effects. ***  She reports {DESC; GOOD/FAIR/POOR:18685} tolerance of treatment. Current symptoms include: {Symptoms; depression:1002} She feels she is {improved/worse/unchanged:3041574} since last visit.  Depression screen PHQ 2/9 09/10/2019  Decreased Interest 3  Down, Depressed, Hopeless 3  PHQ - 2 Score 6  Altered sleeping 3  Tired, decreased energy 2  Change in appetite 2  Feeling bad or failure about yourself  3  Trouble concentrating 0  Moving slowly or fidgety/restless 0  Suicidal thoughts 3  PHQ-9 Score 19  Difficult doing work/chores Not difficult at all    -----------------------------------------------------------------------------------------  Migraine without status migrainosus, not intractable, unspecified migraine type From 09/10/2019-Patient is given some samples of Nurtec to try as needed.  Extreme obesity From 09/10/2019-This is been discussed through the years.  Lifestyle stressed.  Patient Active Problem List   Diagnosis Date Noted  . Acute antritis 11/25/2014  . Allergic rhinitis 11/25/2014  . Arthritis 11/25/2014  . 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 Clinical depression 11/25/2014  . Accumulation of fluid in tissues 11/25/2014  . Blood pressure elevated 11/25/2014  . Esophageal reflux 11/25/2014  . Headache, migraine 11/25/2014  . Extreme obesity 11/25/2014  . Asymptomatic postmenopausal status 11/25/2014  . Peptic ulcer  11/25/2014  . Current tear of meniscus 11/25/2014   Social History   Tobacco Use  . Smoking status: Never Smoker  . Smokeless tobacco: Never Used  Vaping Use  . Vaping Use: Never used  Substance Use Topics  . Alcohol use: Yes    Comment: wine occasionally  . Drug use: No   Allergies  Allergen Reactions  . Molds & Smuts Other (See Comments)    Sneezing  . Other Other (See Comments)    CATS AND DOGS       Medications: Outpatient Medications Prior to Visit  Medication Sig  . albuterol (VENTOLIN HFA) 108 (90 Base) MCG/ACT inhaler Inhale 2 puffs into the lungs every 4 (four) hours as needed for wheezing or shortness of breath.  Marland Kitchen buPROPion (WELLBUTRIN XL) 150 MG 24 hr tablet Take 1 tablet (150 mg total) by mouth daily.  . cetirizine (ZYRTEC) 10 MG tablet Take 10 mg by mouth daily. (Patient not taking: Reported on 09/10/2019)  . citalopram (CELEXA) 20 MG tablet   . doxylamine, Sleep, (UNISOM) 25 MG tablet Take 25 mg by mouth at bedtime as needed. Reported on 03/20/2015  . Fluticasone-Salmeterol (ADVAIR) 250-50 MCG/DOSE AEPB Inhale 1 puff into the lungs daily.  . montelukast (SINGULAIR) 10 MG tablet TAKE ONE TABLET AT BED- TIME.  Marland Kitchen sertraline (ZOLOFT) 100 MG tablet Take 1 tablet (100 mg total) by mouth daily.  . sertraline (ZOLOFT) 50 MG tablet Take 1 tablet (50 mg total) by mouth daily. (Patient not taking: Reported on 09/10/2019)  . SUMAtriptan (IMITREX) 100 MG tablet Take 1 tablet (100 mg total) by mouth daily. May repeat in 2 hours if headache persists or recurs.  . topiramate (TOPAMAX) 200 MG tablet Take 1 tablet (200 mg total) by mouth  daily.   No facility-administered medications prior to visit.    Review of Systems  Constitutional: Negative for appetite change, chills, fatigue and fever.  Respiratory: Negative for chest tightness and shortness of breath.   Cardiovascular: Negative for chest pain and palpitations.  Gastrointestinal: Negative for abdominal pain, nausea and  vomiting.  Neurological: Negative for dizziness and weakness.    Last CBC Lab Results  Component Value Date   WBC 8.0 11/05/2018   HGB 13.4 11/05/2018   HCT 40.0 11/05/2018   MCV 85 11/05/2018   MCH 28.6 11/05/2018   RDW 12.9 11/05/2018   PLT 189 11/05/2018   Last thyroid functions Lab Results  Component Value Date   TSH 2.370 11/05/2018      Objective    There were no vitals taken for this visit. BP Readings from Last 3 Encounters:  09/10/19 (!) 142/93  01/09/19 138/76  11/05/18 133/77   Wt Readings from Last 3 Encounters:  09/10/19 256 lb (116.1 kg)  01/09/19 279 lb (126.6 kg)  11/05/18 285 lb (129.3 kg)      Physical Exam  ***  No results found for any visits on 12/10/19.  Assessment & Plan     ***  No follow-ups on file.      {provider attestation***:1}   Megan Mans, MD  [facility] [phone] (phone) [phone] (fax)  [facility]

## 2019-12-23 ENCOUNTER — Other Ambulatory Visit: Payer: Self-pay | Admitting: Family Medicine

## 2019-12-23 DIAGNOSIS — J4520 Mild intermittent asthma, uncomplicated: Secondary | ICD-10-CM

## 2020-01-11 ENCOUNTER — Other Ambulatory Visit: Payer: Self-pay

## 2020-01-11 DIAGNOSIS — Z20822 Contact with and (suspected) exposure to covid-19: Secondary | ICD-10-CM

## 2020-01-13 ENCOUNTER — Telehealth: Payer: Self-pay | Admitting: Family Medicine

## 2020-01-13 LAB — SARS-COV-2, NAA 2 DAY TAT

## 2020-01-13 LAB — NOVEL CORONAVIRUS, NAA: SARS-CoV-2, NAA: DETECTED — AB

## 2020-01-13 NOTE — Telephone Encounter (Signed)
Patient called to notify office of positive COVID diagnosis

## 2020-01-13 NOTE — Telephone Encounter (Signed)
Fyi. Thanks.

## 2020-01-13 NOTE — Telephone Encounter (Signed)
Is patient vaccinated and what type of symptoms and when did they start?

## 2020-01-14 NOTE — Telephone Encounter (Signed)
Patient reports symptoms started on Saturday 01/10/2020. Patient has had two doses of COVID vaccine.

## 2020-01-14 NOTE — Telephone Encounter (Signed)
Patient advised as below. Patient reports cough is better. And reports she does not need to be referred to ambulatory COVID.

## 2020-01-14 NOTE — Telephone Encounter (Signed)
Have her take Robitussin twice a day Tylenol as needed and then daily zinc, vitamin C, vitamin D. Okay to refer to ambulatory COVID evaluation which is the new referral today.  Is in place of [facility].  Thank you for doing that.

## 2020-03-17 ENCOUNTER — Other Ambulatory Visit: Payer: Self-pay

## 2020-03-17 DIAGNOSIS — G43909 Migraine, unspecified, not intractable, without status migrainosus: Secondary | ICD-10-CM

## 2020-03-17 DIAGNOSIS — J4520 Mild intermittent asthma, uncomplicated: Secondary | ICD-10-CM

## 2020-03-17 NOTE — Telephone Encounter (Signed)
Patient needing refills on: montelukast (SINGULAIR) 10 MG tablet topiramate (TOPAMAX) 200 MG tablet  Please fill at: [facility] - [place], [place] - [address] Phone:  [phone]  Fax:  [phone]     Thanks, [facility]

## 2020-03-19 MED ORDER — TOPIRAMATE 200 MG PO TABS: 200.0000 mg | ORAL_TABLET | Freq: Every day | ORAL | 1 refills | Status: DC

## 2020-03-19 MED ORDER — MONTELUKAST SODIUM 10 MG PO TABS
10.0000 mg | ORAL_TABLET | Freq: Every day | ORAL | 1 refills | Status: DC
Start: 2020-03-19 — End: 2020-11-09

## 2020-04-20 ENCOUNTER — Encounter: Payer: BC Managed Care – PPO | Admitting: Family Medicine

## 2020-05-07 ENCOUNTER — Other Ambulatory Visit: Payer: Self-pay | Admitting: Family Medicine

## 2020-05-07 NOTE — Telephone Encounter (Signed)
   Notes to clinic:  this script has expired  Review for refill   Requested Prescriptions  Pending Prescriptions Disp Refills   SUMAtriptan (IMITREX) 100 MG tablet [Pharmacy Med Name: SUMATRIPTAN SUCC 100 MG TABLET] 9 tablet 0    Sig: Take 1 tablet (100 mg total) by mouth daily. May repeat in 2 hours if headache persists or recurs.      Neurology:  Migraine Therapy - Triptan Failed - 05/07/2020  1:46 PM      Failed - Last BP in normal range    BP Readings from Last 1 Encounters:  09/10/19 (!) 142/93          Passed - Valid encounter within last 12 months    Recent Outpatient Visits           8 months ago Major depressive disorder with current active episode, unspecified depression episode severity, unspecified whether recurrent   [facility] Maple Hudson., MD   1 year ago Major depressive disorder with current active episode, unspecified depression episode severity, unspecified whether recurrent   [facility] Maple Hudson., MD   1 year ago Major depressive disorder with current active episode, unspecified depression episode severity, unspecified whether recurrent   [facility] Maple Hudson., MD   2 years ago Major depressive disorder with single episode, in partial remission [facility])   [facility] Maple Hudson., MD   2 years ago Major depressive disorder with current active episode, unspecified depression episode severity, unspecified whether recurrent   [facility] Maple Hudson., MD       Future Appointments             In 2 months Maple Hudson., MD [facility], [facility]

## 2020-05-19 ENCOUNTER — Other Ambulatory Visit: Payer: Self-pay

## 2020-05-19 ENCOUNTER — Ambulatory Visit: Payer: BC Managed Care – PPO | Admitting: Family Medicine

## 2020-05-19 ENCOUNTER — Ambulatory Visit: Payer: BC Managed Care – PPO | Attending: Neurology

## 2020-05-19 VITALS — BP 120/79 | HR 80 | Ht 60.0 in | Wt 252.4 lb

## 2020-05-19 DIAGNOSIS — G4730 Sleep apnea, unspecified: Secondary | ICD-10-CM | POA: Insufficient documentation

## 2020-05-19 DIAGNOSIS — F329 Major depressive disorder, single episode, unspecified: Secondary | ICD-10-CM

## 2020-05-19 DIAGNOSIS — Z23 Encounter for immunization: Secondary | ICD-10-CM

## 2020-05-19 DIAGNOSIS — G43909 Migraine, unspecified, not intractable, without status migrainosus: Secondary | ICD-10-CM

## 2020-05-19 DIAGNOSIS — E668 Other obesity: Secondary | ICD-10-CM

## 2020-05-19 NOTE — Progress Notes (Signed)
Established patient visit   Patient: Carolyn Walton   DOB: [birth year]   60 y.o. Female  MRN: [id number] Visit Date: 05/19/2020  Today's healthcare provider: Megan Mans, MD   Chief Complaint  Patient presents with  . Snoring   Subjective    HPI  Patient was told to get sleep study. Patient says she snores at night.  She was told last night while visiting her mother at [facility] by a physician that she had apneic spells and snored heavily when she fell asleep      Medications: Outpatient Medications Prior to Visit  Medication Sig  . albuterol (VENTOLIN HFA) 108 (90 Base) MCG/ACT inhaler Inhale 2 puffs into the lungs every 4 (four) hours as needed for wheezing or shortness of breath.  . cetirizine (ZYRTEC) 10 MG tablet Take 10 mg by mouth daily.  . Fluticasone-Salmeterol (ADVAIR) 250-50 MCG/DOSE AEPB Inhale 1 puff into the lungs daily.  . montelukast (SINGULAIR) 10 MG tablet Take 1 tablet (10 mg total) by mouth at bedtime.  . sertraline (ZOLOFT) 100 MG tablet Take 1 tablet (100 mg total) by mouth daily.  . sertraline (ZOLOFT) 50 MG tablet Take 1 tablet (50 mg total) by mouth daily.  . SUMAtriptan (IMITREX) 100 MG tablet Take 1 tablet (100 mg total) by mouth daily. May repeat in 2 hours if headache persists or recurs.  . topiramate (TOPAMAX) 200 MG tablet Take 1 tablet (200 mg total) by mouth daily.  Marland Kitchen buPROPion (WELLBUTRIN XL) 150 MG 24 hr tablet Take 1 tablet (150 mg total) by mouth daily. (Patient not taking: Reported on 05/19/2020)  . citalopram (CELEXA) 20 MG tablet  (Patient not taking: Reported on 05/19/2020)  . doxylamine, Sleep, (UNISOM) 25 MG tablet Take 25 mg by mouth at bedtime as needed. Reported on 03/20/2015 (Patient not taking: Reported on 05/19/2020)   No facility-administered medications prior to visit.    Review of Systems      Objective    BP 120/79 (BP Location: Right Arm, Patient Position: Sitting, Cuff Size: Large)   Pulse 80   Ht 5'  (1.524 m)   Wt 252 lb 6.4 oz (114.5 kg)   BMI 49.29 kg/m  BP Readings from Last 3 Encounters:  05/19/20 120/79  09/10/19 (!) 142/93  01/09/19 138/76   Wt Readings from Last 3 Encounters:  05/19/20 252 lb 6.4 oz (114.5 kg)  09/10/19 256 lb (116.1 kg)  01/09/19 279 lb (126.6 kg)       Physical Exam Vitals reviewed.  Constitutional:      Appearance: She is well-developed.     Comments: Morbidly obese female in no acute distress.  She is pleasant and cooperative.  HENT:     Head: Normocephalic and atraumatic.     Right Ear: External ear normal.     Left Ear: External ear normal.     Nose: Nose normal.  Eyes:     General: No scleral icterus.    Conjunctiva/sclera: Conjunctivae normal.  Neck:     Thyroid: No thyromegaly.  Cardiovascular:     Rate and Rhythm: Normal rate and regular rhythm.     Heart sounds: Normal heart sounds.  Pulmonary:     Effort: Pulmonary effort is normal.     Breath sounds: Normal breath sounds.  Abdominal:     Palpations: Abdomen is soft.  Skin:    General: Skin is warm and dry.  Neurological:     Mental Status: She is alert  and oriented to person, place, and time.  Psychiatric:        Behavior: Behavior normal.        Thought Content: Thought content normal.        Judgment: Judgment normal.       No results found for any visits on 05/19/20.  Assessment & Plan     1. Sleep apnea, unspecified type Obtain home sleep study and will probably need CPAP treatment. - Home sleep test  2. Need for shingles vaccine  - Administer Zoster, Recombinant (Shingrix) Vaccine  3. Major depressive disorder with current active episode, unspecified depression episode severity, unspecified whether recurrent Improved on sertraline and patient is coping with separation/divorce well now.  4. Migraine without status migrainosus, not intractable, unspecified migraine type [name] stable  5. Extreme obesity Working on diet and exercise   No follow-ups  on file.      I, Megan Mans, MD, have reviewed all documentation for this visit. The documentation on 05/28/20 for the exam, diagnosis, procedures, and orders are all accurate and complete.    Teddrick Mallari Wendelyn Breslow, MD  [facility] [phone] (phone) [phone] (fax)  [facility]

## 2020-06-14 ENCOUNTER — Telehealth: Payer: Self-pay

## 2020-06-14 NOTE — Telephone Encounter (Signed)
Hey Carolyn Walton! Can you check the status of this patient's sleep study referral?

## 2020-06-18 ENCOUNTER — Other Ambulatory Visit: Payer: Self-pay | Admitting: Family Medicine

## 2020-06-18 DIAGNOSIS — G43909 Migraine, unspecified, not intractable, without status migrainosus: Secondary | ICD-10-CM

## 2020-06-18 NOTE — Telephone Encounter (Signed)
Requested medication (s) are due for refill today - yes  Requested medication (s) are on the active medication list -yes  Future visit scheduled -yes  Last refill: 05/17/20  Notes to clinic: Request RF-  non delegated Rx  Requested Prescriptions  Pending Prescriptions Disp Refills   topiramate (TOPAMAX) 200 MG tablet [Pharmacy Med Name: TOPIRAMATE 200 MG TABLET] 30 tablet 0    Sig: Take 1 tablet (200 mg total) by mouth daily.      Not Delegated - Neurology: Anticonvulsants - topiramate & zonisamide Failed - 06/18/2020 12:30 PM      Failed - This refill cannot be delegated      Failed - Cr in normal range and within 360 days    Creatinine  Date Value Ref Range Status  10/10/2013 0.70 0.60 - 1.30 mg/dL Final   Creatinine, Ser  Date Value Ref Range Status  11/05/2018 0.77 0.57 - 1.00 mg/dL Final          Failed - CO2 in normal range and within 360 days    CO2  Date Value Ref Range Status  11/05/2018 21 20 - 29 mmol/L Final   Co2  Date Value Ref Range Status  10/10/2013 28 21 - 32 mmol/L Final          Passed - Valid encounter within last 12 months    Recent Outpatient Visits           1 month ago Sleep apnea, unspecified type   [facility] Maple Hudson., MD   9 months ago Major depressive disorder with current active episode, unspecified depression episode severity, unspecified whether recurrent   [facility] Maple Hudson., MD   1 year ago Major depressive disorder with current active episode, unspecified depression episode severity, unspecified whether recurrent   [facility] Maple Hudson., MD   1 year ago Major depressive disorder with current active episode, unspecified depression episode severity, unspecified whether recurrent   [facility] Maple Hudson., MD   2 years ago Major depressive disorder with single episode, in partial remission [facility])   [facility] Maple Hudson., MD       Future Appointments             In 1 month Maple Hudson., MD [facility], [facility]   In 3 months Maple Hudson., MD [facility], [facility]                 Requested Prescriptions  Pending Prescriptions Disp Refills   topiramate (TOPAMAX) 200 MG tablet [Pharmacy Med Name: TOPIRAMATE 200 MG TABLET] 30 tablet 0    Sig: Take 1 tablet (200 mg total) by mouth daily.      Not Delegated - Neurology: Anticonvulsants - topiramate & zonisamide Failed - 06/18/2020 12:30 PM      Failed - This refill cannot be delegated      Failed - Cr in normal range and within 360 days    Creatinine  Date Value Ref Range Status  10/10/2013 0.70 0.60 - 1.30 mg/dL Final   Creatinine, Ser  Date Value Ref Range Status  11/05/2018 0.77 0.57 - 1.00 mg/dL Final          Failed - CO2 in normal range and within 360 days    CO2  Date Value Ref Range Status  11/05/2018 21 20 - 29 mmol/L Final   Co2  Date  Value Ref Range Status  10/10/2013 28 21 - 32 mmol/L Final          Passed - Valid encounter within last 12 months    Recent Outpatient Visits           1 month ago Sleep apnea, unspecified type   [facility] Maple Hudson., MD   9 months ago Major depressive disorder with current active episode, unspecified depression episode severity, unspecified whether recurrent   [facility] Maple Hudson., MD   1 year ago Major depressive disorder with current active episode, unspecified depression episode severity, unspecified whether recurrent   [facility] Maple Hudson., MD   1 year ago Major depressive disorder with current active episode, unspecified depression episode severity, unspecified whether recurrent   [facility] Maple Hudson., MD   2 years ago Major depressive disorder with single episode, in partial remission [facility])    [facility] Maple Hudson., MD       Future Appointments             In 1 month Maple Hudson., MD [facility], [facility]   In 3 months Maple Hudson., MD [facility], [facility]

## 2020-06-22 NOTE — Telephone Encounter (Signed)
I am just finding about the order today.It is under finalized orders. I did send the order to [facility]today 06/22/20 They do home sleep studies now

## 2020-07-21 ENCOUNTER — Ambulatory Visit: Payer: Self-pay

## 2020-07-21 ENCOUNTER — Telehealth: Payer: Self-pay

## 2020-07-21 NOTE — Telephone Encounter (Signed)
Called and triaged patient. Patient states the following:  " Yesterday I was on my patio when out of nowhere I felt like I was punched in the chest and was unable to move. Pain shot up and radiated across my back into my arm and jaw, I have never hurt like this before. My chest felt caved in and pain felt like it lasted forever after pain stopped I must have fell asleep on the patio because when I called my son before I fell asleep he said he was on his way, when my son arrived 2hrs had already passed when he awoke me. My son is a EMT and checked my blood pressure after waking me and it was 134/80."  Patient stated today that her chest and back felt bruised and reports that she was unable to sleep and has had watery stools since last night. I advised patient that an office visit is not appropriate and that she needs immediate medical evaluation at Emergency Department, patient stated that she did not want to go to ED because of wait time and I explained to her that she  had a cardiac event and further imaging and labs need to be done ASAP as well as treatment. Patient was in agreeance to  got to ED. KW

## 2020-07-21 NOTE — Telephone Encounter (Signed)
FYI

## 2020-07-21 NOTE — Telephone Encounter (Signed)
Copied from CRM [id number]. Topic: Appointment Scheduling - Scheduling Inquiry for Clinic >> Jul 21, 2020  8:38 AM Traci Sermon wrote: Reason for CRM: Pt called in stating her son is a EMT and said she had a heart attack yesterday, and wanted to be seen today. Only appt available was tomorrow, pt wants to be seen today. Please advise.

## 2020-07-21 NOTE — Telephone Encounter (Signed)
Pt. Calling in with chest pain yesterday."Had a heart attack yesterday." Per [facility] agent. Call dropped. Called pt. Back and states "the office called me and told me to go to ED." Pt. Reports she will go.

## 2020-07-22 ENCOUNTER — Ambulatory Visit: Payer: BC Managed Care – PPO | Admitting: Family Medicine

## 2020-07-22 ENCOUNTER — Other Ambulatory Visit: Payer: Self-pay

## 2020-07-22 ENCOUNTER — Encounter: Payer: Self-pay | Admitting: Family Medicine

## 2020-07-22 VITALS — BP 159/84 | HR 79 | Temp 97.8°F | Ht 60.0 in | Wt 248.4 lb

## 2020-07-22 DIAGNOSIS — R079 Chest pain, unspecified: Secondary | ICD-10-CM

## 2020-07-22 DIAGNOSIS — R918 Other nonspecific abnormal finding of lung field: Secondary | ICD-10-CM

## 2020-07-22 DIAGNOSIS — R9431 Abnormal electrocardiogram [ECG] [EKG]: Secondary | ICD-10-CM | POA: Diagnosis not present

## 2020-07-22 NOTE — Progress Notes (Signed)
Established patient visit   Patient: Carolyn Walton   DOB: 09-Apr-1960   60 y.o. Female  MRN: [id number] Visit Date: 07/22/2020  Today's healthcare provider: Shirlee Latch, MD   Chief Complaint  Patient presents with   Follow-up    ED on 07/21/20 for chest pain     Subjective    HPI   Follow up ER visit  Patient was admitted to [facility] Emergency Department on 07/21/2020 and discharged on 07/21/2020.  She was treated for chest pains that began on 07/20/2020. Tightness and then arm began to hurt went to jaw and ear afterwards. Hard to breath with back pains as well. Her symptoms alleviate after 5-7 minutes. Fatigue afterwards. She didn't realize that she was sleep for 2 hours after her episode. Treatment for this included self care and follow up with PCP within a week. She reports feeling fine but still tired compliance with treatment.  She reports this condition is improved no longer having pains. She feels like she has been bruised and she has had diarrhea and loss of appetite. She denies smoking but has had second-hand exposure.   She is amenable to referral to a cardiologist.  She is following up with Dr. Sullivan Lone in Sept   normal CXR, EKG with Q waves without ST changes, D dimer elevated, CTA with no dissection or PE, multisubcentimeter nodules (personally reviewed) -----------------------------------------------------------------------------------------  Patient Active Problem List   Diagnosis Date Noted   Acute antritis 11/25/2014   Allergic rhinitis 11/25/2014   Arthritis 11/25/2014   Airway hyperreactivity 11/25/2014   Clinical depression 11/25/2014   Accumulation of fluid in tissues 11/25/2014   Blood pressure elevated 11/25/2014   Esophageal reflux 11/25/2014   Headache, migraine 11/25/2014   Extreme obesity 11/25/2014   Asymptomatic postmenopausal status 11/25/2014   Peptic ulcer 11/25/2014   Current tear of meniscus 11/25/2014   Past Medical  History:  Diagnosis Date   Allergy    Arthritis    Asthma    Frequent headaches    Allergies  Allergen Reactions   Molds & Smuts Other (See Comments)    Sneezing   Other Other (See Comments)    CATS AND DOGS      Medications: Outpatient Medications Prior to Visit  Medication Sig   albuterol (VENTOLIN HFA) 108 (90 Base) MCG/ACT inhaler Inhale 2 puffs into the lungs every 4 (four) hours as needed for wheezing or shortness of breath.   buPROPion (WELLBUTRIN XL) 150 MG 24 hr tablet Take 1 tablet (150 mg total) by mouth daily. (Patient not taking: Reported on 05/19/2020)   cetirizine (ZYRTEC) 10 MG tablet Take 10 mg by mouth daily.   citalopram (CELEXA) 20 MG tablet  (Patient not taking: Reported on 05/19/2020)   doxylamine, Sleep, (UNISOM) 25 MG tablet Take 25 mg by mouth at bedtime as needed. Reported on 03/20/2015 (Patient not taking: Reported on 05/19/2020)   Fluticasone-Salmeterol (ADVAIR) 250-50 MCG/DOSE AEPB Inhale 1 puff into the lungs daily.   montelukast (SINGULAIR) 10 MG tablet Take 1 tablet (10 mg total) by mouth at bedtime.   sertraline (ZOLOFT) 100 MG tablet Take 1 tablet (100 mg total) by mouth daily.   sertraline (ZOLOFT) 50 MG tablet Take 1 tablet (50 mg total) by mouth daily.   SUMAtriptan (IMITREX) 100 MG tablet Take 1 tablet (100 mg total) by mouth daily. May repeat in 2 hours if headache persists or recurs.   topiramate (TOPAMAX) 200 MG tablet Take 1 tablet (200 mg total)  by mouth daily.   No facility-administered medications prior to visit.   Review of Systems  Constitutional:  Negative for chills, fatigue and fever.  HENT:  Negative for ear pain, sinus pressure, sinus pain and sore throat.   Eyes:  Negative for pain and visual disturbance.  Respiratory:  Negative for cough, chest tightness, shortness of breath and wheezing.   Cardiovascular:  Negative for chest pain, palpitations and leg swelling.  Gastrointestinal:  Positive for diarrhea. Negative for abdominal  pain, blood in stool, nausea and vomiting.  Genitourinary:  Negative for flank pain, frequency, pelvic pain and urgency.  Musculoskeletal:  Negative for back pain, myalgias and neck pain.  Neurological:  Negative for dizziness, weakness, light-headedness, numbness and headaches.   Last CBC Lab Results  Component Value Date   WBC 8.0 11/05/2018   HGB 13.4 11/05/2018   HCT 40.0 11/05/2018   MCV 85 11/05/2018   MCH 28.6 11/05/2018   RDW 12.9 11/05/2018   PLT 189 11/05/2018   Last metabolic panel Lab Results  Component Value Date   GLUCOSE 95 11/05/2018   NA 141 11/05/2018   K 4.0 11/05/2018   CL 109 (H) 11/05/2018   CO2 21 11/05/2018   BUN 13 11/05/2018   CREATININE 0.77 11/05/2018   GFRNONAA 85 11/05/2018   GFRAA 98 11/05/2018   CALCIUM 9.1 11/05/2018   PROT 6.7 11/05/2018   ALBUMIN 4.3 11/05/2018   LABGLOB 2.4 11/05/2018   AGRATIO 1.8 11/05/2018   BILITOT 0.2 11/05/2018   ALKPHOS 113 11/05/2018   AST 17 11/05/2018   ALT 10 11/05/2018   ANIONGAP 8 06/13/2017   Last thyroid functions Lab Results  Component Value Date   TSH 2.370 11/05/2018      Objective    There were no vitals taken for this visit. BP Readings from Last 3 Encounters:  05/19/20 120/79  09/10/19 (!) 142/93  01/09/19 138/76    Wt Readings from Last 3 Encounters:  05/19/20 252 lb 6.4 oz (114.5 kg)  09/10/19 256 lb (116.1 kg)  01/09/19 279 lb (126.6 kg)      Physical Exam Vitals reviewed.  Constitutional:      General: She is not in acute distress.    Appearance: Normal appearance. She is well-developed. She is not diaphoretic.  HENT:     Head: Normocephalic and atraumatic.  Eyes:     General: No scleral icterus.    Conjunctiva/sclera: Conjunctivae normal.  Neck:     Thyroid: No thyromegaly.  Cardiovascular:     Rate and Rhythm: Normal rate and regular rhythm.     Pulses: Normal pulses.     Heart sounds: Normal heart sounds. No murmur heard. Pulmonary:     Effort: Pulmonary  effort is normal. No respiratory distress.     Breath sounds: Normal breath sounds. No wheezing, rhonchi or rales.  Musculoskeletal:     Cervical back: Neck supple.     Right lower leg: No edema.     Left lower leg: No edema.  Lymphadenopathy:     Cervical: No cervical adenopathy.  Skin:    General: Skin is warm and dry.     Findings: No rash.  Neurological:     Mental Status: She is alert and oriented to person, place, and time. Mental status is at baseline.  Psychiatric:        Mood and Affect: Mood normal.        Behavior: Behavior normal.     No results found for any visits  on 07/22/20.  Assessment & Plan     Problem List Items Addressed This Visit       Other   Lung nodule, multiple    Noted incidentally on CTA chest to have multiple subcentimeter nodules Patient reports significant secondhand smoke exposure throughout her life Repeat chest CT in 1 year       EKG abnormalities    Q waves without ST changes Cardiology referral as above       Relevant Orders   Ambulatory referral to Cardiology   Chest pain - Primary    New problem 1 episode on 7/19 that is concerning for angina/possible MI Testing ok at ED on 7/20, except for Q waves on EKG Needs cardiac workup - referral placed Discussed importance of going to ED immediately if this recurs - red flags discussed       Relevant Orders   Ambulatory referral to Cardiology     Return in about 4 weeks (around 08/19/2020) for chronic disease f/u, With new PCP.      I,Essence Turner,acting as a Neurosurgeon for Shirlee Latch, MD.,have documented all relevant documentation on the behalf of Shirlee Latch, MD,as directed by  Shirlee Latch, MD while in the presence of Shirlee Latch, MD.  I, Shirlee Latch, MD, have reviewed all documentation for this visit. The documentation on 07/23/20 for the exam, diagnosis, procedures, and orders are all accurate and complete.   Haley Roza, Marzella Schlein, MD,  [facility]

## 2020-07-23 NOTE — Assessment & Plan Note (Signed)
Q waves without ST changes Cardiology referral as above

## 2020-07-23 NOTE — Assessment & Plan Note (Signed)
New problem 1 episode on 7/19 that is concerning for angina/possible MI Testing ok at ED on 7/20, except for Q waves on EKG Needs cardiac workup - referral placed Discussed importance of going to ED immediately if this recurs - red flags discussed

## 2020-07-23 NOTE — Assessment & Plan Note (Signed)
Noted incidentally on CTA chest to have multiple subcentimeter nodules Patient reports significant secondhand smoke exposure throughout her life Repeat chest CT in 1 year

## 2020-07-26 ENCOUNTER — Telehealth: Payer: Self-pay | Admitting: Family Medicine

## 2020-07-26 NOTE — Telephone Encounter (Signed)
Please review. Thanks!  

## 2020-07-26 NOTE — Telephone Encounter (Signed)
Alfonso Patten. Calling from [facility] is calling to request Medical Records regarding the referal for Chest Pain. Fax- [phone] [phone]

## 2020-08-02 ENCOUNTER — Encounter: Payer: BC Managed Care – PPO | Admitting: Family Medicine

## 2020-08-05 ENCOUNTER — Telehealth: Payer: Self-pay

## 2020-08-05 NOTE — Telephone Encounter (Signed)
Copied from CRM [id number]. Topic: General - Inquiry >> Aug 05, 2020  1:09 PM Wyonia Hough E wrote: Reason for CRM: Pt called to inquire about her sleep study result/ please advise

## 2020-08-06 ENCOUNTER — Other Ambulatory Visit: Payer: Self-pay

## 2020-08-09 NOTE — Telephone Encounter (Signed)
Please advise? Received patient's results by fax. Results in provider basket.

## 2020-08-23 ENCOUNTER — Ambulatory Visit: Payer: BC Managed Care – PPO | Admitting: Family Medicine

## 2020-08-23 ENCOUNTER — Other Ambulatory Visit: Payer: Self-pay

## 2020-08-23 ENCOUNTER — Encounter: Payer: Self-pay | Admitting: Family Medicine

## 2020-08-23 VITALS — BP 114/77 | HR 69 | Temp 97.3°F | Wt 257.8 lb

## 2020-08-23 DIAGNOSIS — F329 Major depressive disorder, single episode, unspecified: Secondary | ICD-10-CM | POA: Diagnosis not present

## 2020-08-23 DIAGNOSIS — R079 Chest pain, unspecified: Secondary | ICD-10-CM | POA: Diagnosis not present

## 2020-08-23 DIAGNOSIS — G4730 Sleep apnea, unspecified: Secondary | ICD-10-CM

## 2020-08-23 MED ORDER — BUPROPION HCL ER (XL) 150 MG PO TB24: 150.0000 mg | ORAL_TABLET | Freq: Every day | ORAL | 12 refills | Status: DC

## 2020-08-23 NOTE — Progress Notes (Signed)
I,Roshena L Chambers,acting as a scribe for Megan Mans, MD.,have documented all relevant documentation on the behalf of Megan Mans, MD,as directed by  Megan Mans, MD while in the presence of Megan Mans, MD.   Established patient visit   Patient: Carolyn Walton   DOB: 12-15-1960   60 y.o. Female  MRN: [id number] Visit Date: 08/23/2020  Today's healthcare provider: Megan Mans, MD   Chief Complaint  Patient presents with   Follow-up    Follow up chest pain    Subjective  -------------------------------------------------------------------------------------------------------------------- HPI HPI     Follow-up    Additional comments: Follow up chest pain      Last edited by Benjiman Core, CMA on 08/23/2020  2:42 PM.      Follow up for chest pains   The patient was last seen for this 1 months ago. Changes made at last visit include Testing ok at ED on 7/20, except for Q waves on EKG Needs cardiac workup - referral placed.  Patient was seen by cardiology on 08/09/2020 (Dr. Gwen Pounds). No additional medication was started. Order for sleep study placed.   She reports good compliance with treatment. She feels that condition is Improved. She is not having side effects.   ----------------------------------------------------------------------------------------- Patient does have mild sleep apnea on sleep study but we are told that there are several months behind on getting an auto titrating CPAP machine.  Patient is now divorced but her daughter is expecting a girl in October which will be her first grandchild.  She is excited about this and her mood is much better than it has been in  recent years      Medications: Outpatient Medications Prior to Visit  Medication Sig   albuterol (VENTOLIN HFA) 108 (90 Base) MCG/ACT inhaler Inhale 2 puffs into the lungs every 4 (four) hours as needed for wheezing or shortness of breath.    cetirizine (ZYRTEC) 10 MG tablet Take 10 mg by mouth daily.   citalopram (CELEXA) 20 MG tablet    doxylamine, Sleep, (UNISOM) 25 MG tablet Take 25 mg by mouth at bedtime as needed. Reported on 03/20/2015   Fluticasone-Salmeterol (ADVAIR) 250-50 MCG/DOSE AEPB Inhale 1 puff into the lungs daily.   montelukast (SINGULAIR) 10 MG tablet Take 1 tablet (10 mg total) by mouth at bedtime.   sertraline (ZOLOFT) 100 MG tablet Take 1 tablet (100 mg total) by mouth daily.   sertraline (ZOLOFT) 50 MG tablet Take 1 tablet (50 mg total) by mouth daily.   SUMAtriptan (IMITREX) 100 MG tablet Take 1 tablet (100 mg total) by mouth daily. May repeat in 2 hours if headache persists or recurs.   topiramate (TOPAMAX) 200 MG tablet Take 1 tablet (200 mg total) by mouth daily.   [DISCONTINUED] buPROPion (WELLBUTRIN XL) 150 MG 24 hr tablet Take 1 tablet (150 mg total) by mouth daily.   No facility-administered medications prior to visit.    Review of Systems      Objective  -------------------------------------------------------------------------------------------------------------------- BP 114/77 (BP Location: Right Arm, Patient Position: Sitting, Cuff Size: Large)   Pulse 69   Temp (!) 97.3 F (36.3 C) (Temporal)   Wt 257 lb 12.8 oz (116.9 kg)   BMI 50.35 kg/m  BP Readings from Last 3 Encounters:  08/23/20 114/77  07/22/20 (!) 159/84  05/19/20 120/79   Wt Readings from Last 3 Encounters:  08/23/20 257 lb 12.8 oz (116.9 kg)  07/22/20 248 lb 6.4 oz (112.7 kg)  05/19/20 252 lb 6.4 oz (114.5 kg)       Physical Exam Vitals reviewed.  Constitutional:      Appearance: She is well-developed.     Comments: Morbidly obese female in no acute distress.  She is pleasant and cooperative.  HENT:     Head: Normocephalic and atraumatic.     Right Ear: External ear normal.     Left Ear: External ear normal.     Nose: Nose normal.  Eyes:     General: No scleral icterus.    Conjunctiva/sclera: Conjunctivae  normal.  Neck:     Thyroid: No thyromegaly.  Cardiovascular:     Rate and Rhythm: Normal rate and regular rhythm.     Heart sounds: Normal heart sounds.  Pulmonary:     Effort: Pulmonary effort is normal.     Breath sounds: Normal breath sounds.  Abdominal:     Palpations: Abdomen is soft.  Skin:    General: Skin is warm and dry.  Neurological:     Mental Status: She is alert and oriented to person, place, and time.  Psychiatric:        Behavior: Behavior normal.        Thought Content: Thought content normal.        Judgment: Judgment normal.      No results found for any visits on 08/23/20.  Assessment & Plan  ---------------------------------------------------------------------------------------------------------------------- 1. Chest pain, unspecified type Is noncardiac chest pain.    2. Major depressive disorder with current active episode, unspecified depression episode severity, unspecified whether recurrent Medication pulled for refill.  - buPROPion (WELLBUTRIN XL) 150 MG 24 hr tablet; Take 1 tablet (150 mg total) by mouth daily.  Dispense: 30 tablet; Refill: 12  3. Sleep apnea, unspecified type Will refer to [facility] per patient request.  Otherwise we will try auto titrating CPAP. - Ambulatory referral to Neurology   Return in about 5 months (around 01/23/2021) for CPE.      I, Megan Mans, MD, have reviewed all documentation for this visit. The documentation on 08/29/20 for the exam, diagnosis, procedures, and orders are all accurate and complete.    Rakia Frayne Wendelyn Breslow, MD  [facility] [phone] (phone) [phone] (fax)  [facility]

## 2020-09-17 ENCOUNTER — Other Ambulatory Visit: Payer: Self-pay | Admitting: Family Medicine

## 2020-09-17 DIAGNOSIS — J4521 Mild intermittent asthma with (acute) exacerbation: Secondary | ICD-10-CM

## 2020-09-18 NOTE — Telephone Encounter (Signed)
Requested medication (s) are due for refill today: yes  Requested medication (s) are on the active medication list: yes  Last refill:  11/05/18  Future visit scheduled: yes  Notes to clinic:  unable to find OV where med was specifically adress   Requested Prescriptions  Pending Prescriptions Disp Refills   ADVAIR DISKUS 250-50 MCG/ACT AEPB [Pharmacy Med Name: ADVAIR 250-50 DISKUS] 60 each 0    Sig: Inhale 1 puff into the lungs daily.     Pulmonology:  Combination Products Passed - 09/17/2020  5:02 PM      Passed - Valid encounter within last 12 months    Recent Outpatient Visits           3 weeks ago Chest pain, unspecified type   [facility] Maple Hudson., MD   1 month ago Chest pain, unspecified type   [facility], Marzella Schlein, MD   4 months ago Sleep apnea, unspecified type   [facility] Maple Hudson., MD   1 year ago Major depressive disorder with current active episode, unspecified depression episode severity, unspecified whether recurrent   [facility] Maple Hudson., MD   1 year ago Major depressive disorder with current active episode, unspecified depression episode severity, unspecified whether recurrent   [facility] Maple Hudson., MD       Future Appointments             In 4 months Maple Hudson., MD [facility], [facility]

## 2020-09-27 ENCOUNTER — Ambulatory Visit: Payer: Self-pay | Admitting: Family Medicine

## 2020-11-09 ENCOUNTER — Other Ambulatory Visit: Payer: Self-pay | Admitting: Family Medicine

## 2020-11-09 DIAGNOSIS — J4520 Mild intermittent asthma, uncomplicated: Secondary | ICD-10-CM

## 2020-12-16 ENCOUNTER — Other Ambulatory Visit: Payer: Self-pay | Admitting: Family Medicine

## 2020-12-16 DIAGNOSIS — G43909 Migraine, unspecified, not intractable, without status migrainosus: Secondary | ICD-10-CM

## 2020-12-17 NOTE — Telephone Encounter (Signed)
Please advise refill?  LOV: 08/23/2020 NOV: 01/17/2021 Last refill: 06/21/2020 #30 w/5 refills

## 2020-12-17 NOTE — Telephone Encounter (Signed)
Requested medication (s) are due for refill today: yes  Requested medication (s) are on the active medication list: yes  Last refill:  11/19/20  Future visit scheduled: 01/17/21  Notes to clinic:  This medication can not be delegated, please assess. Labs are from 2020.   Requested Prescriptions  Pending Prescriptions Disp Refills   topiramate (TOPAMAX) 200 MG tablet [Pharmacy Med Name: TOPIRAMATE 200 MG TABLET] 30 tablet 0    Sig: Take 1 tablet (200 mg total) by mouth daily.     Not Delegated - Neurology: Anticonvulsants - topiramate & zonisamide Failed - 12/16/2020  1:05 PM      Failed - This refill cannot be delegated      Failed - Cr in normal range and within 360 days    Creatinine  Date Value Ref Range Status  10/10/2013 0.70 0.60 - 1.30 mg/dL Final   Creatinine, Ser  Date Value Ref Range Status  11/05/2018 0.77 0.57 - 1.00 mg/dL Final          Failed - CO2 in normal range and within 360 days    CO2  Date Value Ref Range Status  11/05/2018 21 20 - 29 mmol/L Final   Co2  Date Value Ref Range Status  10/10/2013 28 21 - 32 mmol/L Final          Passed - Valid encounter within last 12 months    Recent Outpatient Visits           3 months ago Chest pain, unspecified type   [facility] Maple Hudson., MD   4 months ago Chest pain, unspecified type   [place], Marzella Schlein, MD   7 months ago Sleep apnea, unspecified type   [facility] Maple Hudson., MD   1 year ago Major depressive disorder with current active episode, unspecified depression episode severity, unspecified whether recurrent   [facility] Maple Hudson., MD   1 year ago Major depressive disorder with current active episode, unspecified depression episode severity, unspecified whether recurrent   [facility] Maple Hudson., MD       Future Appointments             In 1 month  Maple Hudson., MD [facility], [facility]

## 2020-12-24 ENCOUNTER — Other Ambulatory Visit: Payer: Self-pay | Admitting: Family Medicine

## 2020-12-24 DIAGNOSIS — J4521 Mild intermittent asthma with (acute) exacerbation: Secondary | ICD-10-CM

## 2020-12-24 NOTE — Telephone Encounter (Signed)
Requested Prescriptions  Pending Prescriptions Disp Refills   ADVAIR DISKUS 250-50 MCG/ACT AEPB [Pharmacy Med Name: ADVAIR 250-50 DISKUS] 60 each 0    Sig: Inhale 1 puff into the lungs daily.     Pulmonology:  Combination Products Passed - 12/24/2020 11:39 AM      Passed - Valid encounter within last 12 months    Recent Outpatient Visits          4 months ago Chest pain, unspecified type   [facility] Maple Hudson., MD   5 months ago Chest pain, unspecified type   [facility], Marzella Schlein, MD   7 months ago Sleep apnea, unspecified type   [facility] Maple Hudson., MD   1 year ago Major depressive disorder with current active episode, unspecified depression episode severity, unspecified whether recurrent   [facility] Maple Hudson., MD   1 year ago Major depressive disorder with current active episode, unspecified depression episode severity, unspecified whether recurrent   [facility] Maple Hudson., MD      Future Appointments            In 3 weeks Maple Hudson., MD [facility], [facility]

## 2021-01-13 ENCOUNTER — Other Ambulatory Visit: Payer: Self-pay | Admitting: Family Medicine

## 2021-01-13 DIAGNOSIS — G43909 Migraine, unspecified, not intractable, without status migrainosus: Secondary | ICD-10-CM

## 2021-01-13 NOTE — Telephone Encounter (Signed)
Requested medication (s) are due for refill today   Yes    Last ordered 12/17/20  #90 with no refills.  Requested medication (s) are on the active medication list   Yes  Future visit scheduled No  Note to clinic-Medication is non delegated. Labs do not meet protocols.    Requested Prescriptions  Pending Prescriptions Disp Refills   topiramate (TOPAMAX) 200 MG tablet [Pharmacy Med Name: TOPIRAMATE 200 MG TABLET] 30 tablet 0    Sig: Take 1 tablet (200 mg total) by mouth daily.     Not Delegated - Neurology: Anticonvulsants - topiramate & zonisamide Failed - 01/13/2021  1:59 PM      Failed - This refill cannot be delegated      Failed - Cr in normal range and within 360 days    Creatinine  Date Value Ref Range Status  10/10/2013 0.70 0.60 - 1.30 mg/dL Final   Creatinine, Ser  Date Value Ref Range Status  11/05/2018 0.77 0.57 - 1.00 mg/dL Final          Failed - CO2 in normal range and within 360 days    CO2  Date Value Ref Range Status  11/05/2018 21 20 - 29 mmol/L Final   Co2  Date Value Ref Range Status  10/10/2013 28 21 - 32 mmol/L Final          Passed - Valid encounter within last 12 months    Recent Outpatient Visits           4 months ago Chest pain, unspecified type   [facility] Maple Hudson., MD   5 months ago Chest pain, unspecified type   [facility], Marzella Schlein, MD   7 months ago Sleep apnea, unspecified type   [facility] Maple Hudson., MD   1 year ago Major depressive disorder with current active episode, unspecified depression episode severity, unspecified whether recurrent   [facility] Maple Hudson., MD   2 years ago Major depressive disorder with current active episode, unspecified depression episode severity, unspecified whether recurrent   [facility] Maple Hudson., MD

## 2021-01-13 NOTE — Telephone Encounter (Signed)
LOV:  08/23/2020   Thanks,   -Mickel Baas

## 2021-01-17 ENCOUNTER — Encounter: Payer: Self-pay | Admitting: Family Medicine

## 2021-02-11 ENCOUNTER — Other Ambulatory Visit: Payer: Self-pay | Admitting: Physician Assistant

## 2021-02-11 DIAGNOSIS — G43909 Migraine, unspecified, not intractable, without status migrainosus: Secondary | ICD-10-CM

## 2021-03-22 ENCOUNTER — Other Ambulatory Visit: Payer: Self-pay | Admitting: Physician Assistant

## 2021-03-22 DIAGNOSIS — G43909 Migraine, unspecified, not intractable, without status migrainosus: Secondary | ICD-10-CM

## 2021-04-13 ENCOUNTER — Ambulatory Visit: Payer: BC Managed Care – PPO | Admitting: Family Medicine

## 2021-04-13 ENCOUNTER — Encounter: Payer: Self-pay | Admitting: Family Medicine

## 2021-04-13 VITALS — BP 150/67 | HR 61 | Resp 16 | Ht 60.0 in | Wt 270.5 lb

## 2021-04-13 DIAGNOSIS — G43909 Migraine, unspecified, not intractable, without status migrainosus: Secondary | ICD-10-CM

## 2021-04-13 DIAGNOSIS — Z6841 Body Mass Index (BMI) 40.0 and over, adult: Secondary | ICD-10-CM | POA: Diagnosis not present

## 2021-04-13 DIAGNOSIS — E669 Obesity, unspecified: Secondary | ICD-10-CM

## 2021-04-13 DIAGNOSIS — I10 Essential (primary) hypertension: Secondary | ICD-10-CM | POA: Diagnosis not present

## 2021-04-13 DIAGNOSIS — F329 Major depressive disorder, single episode, unspecified: Secondary | ICD-10-CM | POA: Diagnosis not present

## 2021-04-13 DIAGNOSIS — E668 Other obesity: Secondary | ICD-10-CM

## 2021-04-13 DIAGNOSIS — J309 Allergic rhinitis, unspecified: Secondary | ICD-10-CM | POA: Diagnosis not present

## 2021-04-13 DIAGNOSIS — G4730 Sleep apnea, unspecified: Secondary | ICD-10-CM

## 2021-04-13 MED ORDER — WEGOVY 0.25 MG/0.5ML ~~LOC~~ SOAJ: 0.2500 mg | SUBCUTANEOUS | 2 refills | Status: DC

## 2021-04-13 NOTE — Progress Notes (Signed)
?  ? ? ?Established patient visit ? ? ?Patient: Carolyn Walton   DOB: 1960/09/01   60 y.o. Female  MRN: [id number] ?Visit Date: 04/13/2021 ? ?Today's healthcare provider: Megan Mansichard Elizar Alpern Jr, MD  ? ?Chief Complaint  ?Patient presents with  ? Obesity  ?  Patient comes in office today to address concern of weight gain for over a year. Patient reports that she eats 3 balanced meals a day with 1 snack, patient is staying active by walking dogs daily, patient states on average she sleeps 4-5hrs of sleep at night. Patient has tried weight loss programs in past before such as weight watchers but was unsuccessful , patient would like to discuss [facility].   ? ?Subjective  ?  ?HPI ?Patient comes in today with the below issues with obesity and weight gain.  He is in a much better mood and a new boyfriend.  She is enjoying her job.  Patient states she no longer feels depressed.  ? And other than the weight gain and allergy symptoms she feels well.  She has tried diet pills in the past with no relief. ?HPI   ? ? Obesity   ? Additional comments: Patient comes in office today to address concern of weight gain for over a year. Patient reports that she eats 3 balanced meals a day with 1 snack, patient is staying active by walking dogs daily, patient states on average she sleeps 4-5hrs of sleep at night. Patient has tried weight loss programs in past before such as weight watchers but was unsuccessful , patient would like to discuss [facility].  ? ?  ?  ?Last edited by Fonda KinderWolford, Kathleen J, CMA on 04/13/2021  9:56 AM.  ?  ?  ?Patient is here to discuss weight loss. ? ?Medications: ?Outpatient Medications Prior to Visit  ?Medication Sig  ? ADVAIR DISKUS 250-50 MCG/ACT AEPB Inhale 1 puff into the lungs daily.  ? albuterol (VENTOLIN HFA) 108 (90 Base) MCG/ACT inhaler Inhale 2 puffs into the lungs every 4 (four) hours as needed for wheezing or shortness of breath.  ? buPROPion (WELLBUTRIN XL) 150  MG 24 hr tablet Take 1 tablet (150 mg total) by mouth daily.  ? cetirizine (ZYRTEC) 10 MG tablet Take 10 mg by mouth daily.  ? citalopram (CELEXA) 20 MG tablet   ? doxylamine, Sleep, (UNISOM) 25 MG tablet Take 25 mg by mouth at bedtime as needed. Reported on 03/20/2015  ? montelukast (SINGULAIR) 10 MG tablet Take 1 tablet (10 mg total) by mouth at bedtime.  ? sertraline (ZOLOFT) 100 MG tablet Take 1 tablet (100 mg total) by mouth daily.  ? sertraline (ZOLOFT) 50 MG tablet Take 1 tablet (50 mg total) by mouth daily.  ? SUMAtriptan (IMITREX) 100 MG tablet Take 1 tablet (100 mg total) by mouth daily. May repeat in 2 hours if headache persists or recurs.  ? topiramate (TOPAMAX) 200 MG tablet Take 1 tablet (200 mg total) by mouth daily.  ? ?No facility-administered medications prior to visit.  ? ? ?Review of Systems  ?Constitutional:  Negative for appetite change, chills, fatigue and fever.  ?Respiratory:  Negative for chest tightness and shortness of breath.   ?Cardiovascular:  Negative for chest pain and palpitations.  ?Gastrointestinal:  Negative for abdominal pain, nausea and vomiting.  ?Neurological:  Negative for dizziness and weakness.  ? ?Last thyroid functions ?Lab Results  ?Component Value Date  ? TSH 2.370 11/05/2018  ? ?  ?  Objective  ?  ?BP (!) 150/67   Pulse 61   Resp 16   Ht 5' (1.524 m)   Wt 270 lb 8 oz (122.7 kg)   SpO2 100%   BMI 52.83 kg/m?  ?BP Readings from Last 3 Encounters:  ?04/13/21 (!) 150/67  ?08/23/20 114/77  ?07/22/20 (!) 159/84  ? ?Wt Readings from Last 3 Encounters:  ?04/13/21 270 lb 8 oz (122.7 kg)  ?08/23/20 257 lb 12.8 oz (116.9 kg)  ?07/22/20 248 lb 6.4 oz (112.7 kg)  ? ?  ? ?Physical Exam ?Vitals reviewed.  ?Constitutional:   ?   Appearance: She is well-developed.  ?   Comments: Morbidly obese female in no acute distress.  She is pleasant and cooperative.  ?HENT:  ?   Head: Normocephalic and atraumatic.  ?   Right Ear: External ear normal.  ?   Left Ear: External ear normal.  ?    Nose: Nose normal.  ?Eyes:  ?   General: No scleral icterus. ?   Conjunctiva/sclera: Conjunctivae normal.  ?Neck:  ?   Thyroid: No thyromegaly.  ?Cardiovascular:  ?   Rate and Rhythm: Normal rate and regular rhythm.  ?   Heart sounds: Normal heart sounds.  ?Pulmonary:  ?   Effort: Pulmonary effort is normal.  ?   Breath sounds: Normal breath sounds.  ?Abdominal:  ?   Palpations: Abdomen is soft.  ?Skin: ?   General: Skin is warm and dry.  ?Neurological:  ?   General: No focal deficit present.  ?   Mental Status: She is alert and oriented to person, place, and time.  ?Psychiatric:     ?   Mood and Affect: Mood normal.     ?   Behavior: Behavior normal.     ?   Thought Content: Thought content normal.     ?   Judgment: Judgment normal.  ?  ? ? ?No results found for any visits on 04/13/21. ? Assessment & Plan  ?  ? ?1. Extreme obesity BMI 52.83 ?Refer to medical weight management group. ? ?2. BMI 50.0-59.9, adult (HCC) ? ?- CBC w/Diff/Platelet ?- Comprehensive Metabolic Panel (CMET) ?- TSH ?- Lipid panel ?- Hemoglobin A1c ?- Semaglutide-Weight Management (WEGOVY) 0.25 MG/0.5ML SOAJ; Inject 0.25 mg into the skin once a week.  Dispense: 2 mL; Refill: 2 ? ?3. Hypertension, unspecified type ?Follow home blood pressures and bring in these readings in a couple of months. ?- CBC w/Diff/Platelet ?- Comprehensive Metabolic Panel (CMET) ?- TSH ?- Lipid panel ?- Hemoglobin A1c ? ?4. Allergic rhinitis, unspecified seasonality, unspecified trigger ? ?- CBC w/Diff/Platelet ?- Comprehensive Metabolic Panel (CMET) ?- TSH ?- Lipid panel ?- Hemoglobin A1c ? ?5. Major depressive disorder with current active episode, unspecified depression episode severity, unspecified whether recurrent ?Wean off sertraline over the next month.  Follow-up in a few months to make sure she is doing okay. ?- CBC w/Diff/Platelet ?- Comprehensive Metabolic Panel (CMET) ?- TSH ?- Lipid panel ?- Hemoglobin A1c ? ?6. Sleep apnea, unspecified type ? ? ?7. Migraine  without status migrainosus, not intractable, unspecified migraine type ?Stable recently. ? ? ?Return in about 2 months (around 06/13/2021).  ?   ? ?I, Megan Mans, MD, have reviewed all documentation for this visit. The documentation on 04/20/21 for the exam, diagnosis, procedures, and orders are all accurate and complete. ? ? ?[employer] as a Neurosurgeon for [employer], MD.,have documented all relevant documentation on the behalf of Julieanne Manson  Montez Hageman, MD,as directed by  Megan Mans, MD while in the presence of Megan Mans, MD.  ? ?Megan Mans, MD  ?[facility] ?[phone] (phone) ?[phone] (fax) ? ? Medical Group ?

## 2021-04-13 NOTE — Patient Instructions (Signed)
DECREASE SERTRALINE TO 1/2 PILL FOR ONE WEEK. THAN 1/2 PILL EVERY OTHER DAY FOR ONE WEEK AND THAN STOP MEDICATION. ?

## 2021-04-14 LAB — COMPREHENSIVE METABOLIC PANEL
ALT: 28 IU/L (ref 0–32)
AST: 27 IU/L (ref 0–40)
Albumin/Globulin Ratio: 2.3 — ABNORMAL HIGH (ref 1.2–2.2)
Albumin: 4.3 g/dL (ref 3.8–4.9)
Alkaline Phosphatase: 116 IU/L (ref 44–121)
BUN/Creatinine Ratio: 24 (ref 12–28)
BUN: 15 mg/dL (ref 8–27)
Bilirubin Total: 0.3 mg/dL (ref 0.0–1.2)
CO2: 25 mmol/L (ref 20–29)
Calcium: 9.4 mg/dL (ref 8.7–10.3)
Chloride: 104 mmol/L (ref 96–106)
Creatinine, Ser: 0.62 mg/dL (ref 0.57–1.00)
Globulin, Total: 1.9 g/dL (ref 1.5–4.5)
Glucose: 90 mg/dL (ref 70–99)
Potassium: 4.5 mmol/L (ref 3.5–5.2)
Sodium: 141 mmol/L (ref 134–144)
Total Protein: 6.2 g/dL (ref 6.0–8.5)
eGFR: 102 mL/min/{1.73_m2} (ref >59)

## 2021-04-14 LAB — CBC WITH DIFFERENTIAL/PLATELET
Basophils Absolute: 0 10*3/uL (ref 0.0–0.2)
Basos: 1 %
EOS (ABSOLUTE): 0.2 10*3/uL (ref 0.0–0.4)
Eos: 3 %
Hematocrit: 43.8 % (ref 34.0–46.6)
Hemoglobin: 14.2 g/dL (ref 11.1–15.9)
Immature Grans (Abs): 0 10*3/uL (ref 0.0–0.1)
Immature Granulocytes: 1 %
Lymphocytes Absolute: 1.9 10*3/uL (ref 0.7–3.1)
Lymphs: 30 %
MCH: 28.3 pg (ref 26.6–33.0)
MCHC: 32.4 g/dL (ref 31.5–35.7)
MCV: 87 fL (ref 79–97)
Monocytes Absolute: 0.4 10*3/uL (ref 0.1–0.9)
Monocytes: 7 %
Neutrophils Absolute: 3.6 10*3/uL (ref 1.4–7.0)
Neutrophils: 58 %
Platelets: 167 10*3/uL (ref 150–450)
RBC: 5.01 x10E6/uL (ref 3.77–5.28)
RDW: 12.8 % (ref 11.7–15.4)
WBC: 6.1 10*3/uL (ref 3.4–10.8)

## 2021-04-14 LAB — HEMOGLOBIN A1C
Est. average glucose Bld gHb Est-mCnc: 100 mg/dL
Hgb A1c MFr Bld: 5.1 % (ref 4.8–5.6)

## 2021-04-14 LAB — LIPID PANEL
Chol/HDL Ratio: 3.1 ratio (ref 0.0–4.4)
Cholesterol, Total: 202 mg/dL — ABNORMAL HIGH (ref 100–199)
HDL: 66 mg/dL (ref >39)
LDL Chol Calc (NIH): 119 mg/dL — ABNORMAL HIGH (ref 0–99)
Triglycerides: 98 mg/dL (ref 0–149)
VLDL Cholesterol Cal: 17 mg/dL (ref 5–40)

## 2021-04-14 LAB — TSH: TSH: 3.3 u[IU]/mL (ref 0.450–4.500)

## 2021-04-18 ENCOUNTER — Other Ambulatory Visit: Payer: Self-pay | Admitting: Family Medicine

## 2021-04-18 DIAGNOSIS — G43909 Migraine, unspecified, not intractable, without status migrainosus: Secondary | ICD-10-CM

## 2021-04-19 NOTE — Telephone Encounter (Signed)
Requested Prescriptions  ?Pending Prescriptions Disp Refills  ?? topiramate (TOPAMAX) 200 MG tablet [Pharmacy Med Name: TOPIRAMATE 200 MG TABLET] 30 tablet 0  ?  Sig: Take 1 tablet (200 mg total) by mouth daily.  ?  ? Neurology: Anticonvulsants - topiramate & zonisamide Passed - 04/18/2021 12:48 PM  ?  ?  Passed - Cr in normal range and within 360 days  ?  Creatinine  ?Date Value Ref Range Status  ?10/10/2013 0.70 0.60 - 1.30 mg/dL Final  ? ?Creatinine, Ser  ?Date Value Ref Range Status  ?04/13/2021 0.62 0.57 - 1.00 mg/dL Final  ?   ?  ?  Passed - CO2 in normal range and within 360 days  ?  CO2  ?Date Value Ref Range Status  ?04/13/2021 25 20 - 29 mmol/L Final  ? ?Co2  ?Date Value Ref Range Status  ?10/10/2013 28 21 - 32 mmol/L Final  ?   ?  ?  Passed - ALT in normal range and within 360 days  ?  ALT  ?Date Value Ref Range Status  ?04/13/2021 28 0 - 32 IU/L Final  ?   ?  ?  Passed - AST in normal range and within 360 days  ?  AST  ?Date Value Ref Range Status  ?04/13/2021 27 0 - 40 IU/L Final  ?   ?  ?  Passed - Completed PHQ-2 or PHQ-9 in the last 360 days  ?  ?  Passed - Valid encounter within last 12 months  ?  Recent Outpatient Visits   ?      ? 6 days ago BMI 50.0-59.9, adult (HCC)  ? [facility] Maple Hudson., MD  ? 7 months ago Chest pain, unspecified type  ? [facility] Maple Hudson., MD  ? 9 months ago Chest pain, unspecified type  ? [facility], Marzella Schlein, MD  ? 11 months ago Sleep apnea, unspecified type  ? [facility] Maple Hudson., MD  ? 1 year ago Major depressive disorder with current active episode, unspecified depression episode severity, unspecified whether recurrent  ? [facility] Maple Hudson., MD  ?  ?  ?Future Appointments   ?        ? In 1 month Maple Hudson., MD [facility]  ?  ? ?  ?  ?  ? ? ?

## 2021-05-20 ENCOUNTER — Other Ambulatory Visit: Payer: Self-pay | Admitting: Family Medicine

## 2021-05-20 DIAGNOSIS — J4520 Mild intermittent asthma, uncomplicated: Secondary | ICD-10-CM

## 2021-05-20 NOTE — Telephone Encounter (Signed)
Requested Prescriptions  Pending Prescriptions Disp Refills  . montelukast (SINGULAIR) 10 MG tablet [Pharmacy Med Name: MONTELUKAST SOD 10 MG TABLET] 90 tablet 3    Sig: Take 1 tablet (10 mg total) by mouth at bedtime.     Pulmonology:  Leukotriene Inhibitors Passed - 05/20/2021 11:14 AM      Passed - Valid encounter within last 12 months    Recent Outpatient Visits          1 month ago BMI 50.0-59.9, adult [facility])   [facility] Maple Hudson., MD   9 months ago Chest pain, unspecified type   [facility] Maple Hudson., MD   10 months ago Chest pain, unspecified type   [facility], Marzella Schlein, MD   1 year ago Sleep apnea, unspecified type   [facility] Maple Hudson., MD   1 year ago Major depressive disorder with current active episode, unspecified depression episode severity, unspecified whether recurrent   [facility] Maple Hudson., MD      Future Appointments            In 3 weeks Maple Hudson., MD [facility]

## 2021-06-13 ENCOUNTER — Ambulatory Visit: Payer: BC Managed Care – PPO | Admitting: Family Medicine

## 2021-06-13 ENCOUNTER — Encounter: Payer: Self-pay | Admitting: Family Medicine

## 2021-06-13 VITALS — BP 138/82 | HR 81 | Resp 16 | Wt 264.0 lb

## 2021-06-13 DIAGNOSIS — G43909 Migraine, unspecified, not intractable, without status migrainosus: Secondary | ICD-10-CM

## 2021-06-13 DIAGNOSIS — Z6841 Body Mass Index (BMI) 40.0 and over, adult: Secondary | ICD-10-CM

## 2021-06-13 DIAGNOSIS — J4520 Mild intermittent asthma, uncomplicated: Secondary | ICD-10-CM

## 2021-06-13 DIAGNOSIS — E668 Other obesity: Secondary | ICD-10-CM | POA: Diagnosis not present

## 2021-06-13 DIAGNOSIS — F325 Major depressive disorder, single episode, in full remission: Secondary | ICD-10-CM | POA: Diagnosis not present

## 2021-06-13 DIAGNOSIS — J309 Allergic rhinitis, unspecified: Secondary | ICD-10-CM

## 2021-06-13 MED ORDER — WEGOVY 0.5 MG/0.5ML ~~LOC~~ SOAJ: 0.5000 mg | SUBCUTANEOUS | 2 refills | Status: DC

## 2021-06-13 NOTE — Progress Notes (Unsigned)
Established patient visit  I,Carolyn Walton,acting as a scribe for Carolyn Durie, MD.,have documented all relevant documentation on the behalf of Carolyn Durie, MD,as directed by  Carolyn Durie, MD while in the presence of Carolyn Durie, MD.   Patient: Carolyn Walton   DOB: [birth year]   61 y.o. Female  MRN: [id number] Visit Date: 06/13/2021  Today's healthcare provider: Wilhemena Durie, MD   Chief Complaint  Patient presents with   Follow-up   Depression   Obesity   Subjective    HPI  Patient is feeling great and is tolerating the 0.25 weekly dose of Wegovy.  She  has lost 7 pounds in 2 months She tried to come off of the Zoloft and did not do well.  She feels great being back on it.  She has 0 complaints today. Follow up for Obesity  The patient was last seen for this 2 months ago. Changes made at last visit include; Referred to medical weight management group.  ----------------------------------------------------------------------------------------- Depression, Follow-up  She  was last seen for this 2 months ago. Changes made at last visit include; Wean off sertraline over the next month.  Follow-up in a few months to make sure she is doing okay.       06/13/2021    4:23 PM 07/22/2020    3:13 PM 07/22/2020    3:12 PM  Depression screen PHQ 2/9  Decreased Interest 0 0 0  Down, Depressed, Hopeless 0 1 1  PHQ - 2 Score 0 1 1  Altered sleeping 3 1   Tired, decreased energy 2 0   Change in appetite 1 1   Feeling bad or failure about yourself  0 1   Trouble concentrating 0 0   Moving slowly or fidgety/restless 0 0   Suicidal thoughts 0 0   PHQ-9 Score 6 4   Difficult doing work/chores Not difficult at all Not difficult at all     -----------------------------------------------------------------------------------------   Medications: Outpatient Medications Prior to Visit  Medication Sig   ADVAIR DISKUS 250-50 MCG/ACT AEPB Inhale 1 puff  into the lungs daily.   albuterol (VENTOLIN HFA) 108 (90 Base) MCG/ACT inhaler Inhale 2 puffs into the lungs every 4 (four) hours as needed for wheezing or shortness of breath.   buPROPion (WELLBUTRIN XL) 150 MG 24 hr tablet Take 1 tablet (150 mg total) by mouth daily.   cetirizine (ZYRTEC) 10 MG tablet Take 10 mg by mouth daily.   citalopram (CELEXA) 20 MG tablet    montelukast (SINGULAIR) 10 MG tablet Take 1 tablet (10 mg total) by mouth at bedtime.   Semaglutide-Weight Management (WEGOVY) 0.25 MG/0.5ML SOAJ Inject 0.25 mg into the skin once a week.   sertraline (ZOLOFT) 100 MG tablet Take 1 tablet (100 mg total) by mouth daily.   SUMAtriptan (IMITREX) 100 MG tablet Take 1 tablet (100 mg total) by mouth daily. May repeat in 2 hours if headache persists or recurs.   topiramate (TOPAMAX) 200 MG tablet Take 1 tablet (200 mg total) by mouth daily.   [DISCONTINUED] doxylamine, Sleep, (UNISOM) 25 MG tablet Take 25 mg by mouth at bedtime as needed. Reported on 03/20/2015   [DISCONTINUED] sertraline (ZOLOFT) 50 MG tablet Take 1 tablet (50 mg total) by mouth daily.   No facility-administered medications prior to visit.    Review of Systems  Constitutional:  Negative for appetite change, chills, fatigue and fever.  Respiratory:  Negative for chest tightness and shortness  of breath.   Cardiovascular:  Negative for chest pain and palpitations.  Gastrointestinal:  Negative for abdominal pain, nausea and vomiting.  Neurological:  Negative for dizziness and weakness.    Last thyroid functions Lab Results  Component Value Date   TSH 3.300 04/13/2021       Objective    BP 138/82 (BP Location: Right Arm, Patient Position: Sitting, Cuff Size: Large)   Pulse 81   Resp 16   Wt 264 lb (119.7 kg)   SpO2 98%   BMI 51.56 kg/m  BP Readings from Last 3 Encounters:  06/13/21 138/82  04/13/21 (!) 150/67  08/23/20 114/77   Wt Readings from Last 3 Encounters:  06/13/21 264 lb (119.7 kg)  04/13/21 270  lb 8 oz (122.7 kg)  08/23/20 257 lb 12.8 oz (116.9 kg)      Physical Exam Vitals reviewed.  Constitutional:      General: She is not in acute distress.    Appearance: She is well-developed.  HENT:     Head: Normocephalic and atraumatic.     Right Ear: Hearing normal.     Left Ear: Hearing normal.     Nose: Nose normal.  Eyes:     General: Lids are normal. No scleral icterus.       Right eye: No discharge.        Left eye: No discharge.     Conjunctiva/sclera: Conjunctivae normal.  Cardiovascular:     Rate and Rhythm: Normal rate and regular rhythm.     Heart sounds: Normal heart sounds.  Pulmonary:     Effort: Pulmonary effort is normal. No respiratory distress.  Skin:    Findings: No lesion or rash.  Neurological:     General: No focal deficit present.     Mental Status: She is alert and oriented to person, place, and time.  Psychiatric:        Mood and Affect: Mood normal.        Speech: Speech normal.        Behavior: Behavior normal.        Thought Content: Thought content normal.        Judgment: Judgment normal.       No results found for any visits on 06/13/21.  Assessment & Plan     1. BMI 50.0-59.9, adult (HCC) Increase we COVID to 0.5 mg weekly.  Follow-up in 3 months on this dose.  We will continue that to advance the dose as long as she tolerates it. - Semaglutide-Weight Management (WEGOVY) 0.5 MG/0.5ML SOAJ; Inject 0.5 mg into the skin once a week.  Dispense: 2 mL; Refill: 2  2. Extreme obesity   3. Migraine without status migrainosus, not intractable, unspecified migraine type Clinically stable  4. Major depressive disorder with single episode, in full remission ([place]) Back on Zoloft and will continue this indefinitely along with the Wellbutrin  5. Allergic rhinitis, unspecified seasonality, unspecified trigger   6. Mild intermittent asthma, unspecified whether complicated    No follow-ups on file.      I, Carolyn Durie, MD, have  reviewed all documentation for this visit. The documentation on 06/15/21 for the exam, diagnosis, procedures, and orders are all accurate and complete.    Carolyn Walton Mon, MD  [facility] [phone] (phone) [phone] (fax)  [place]

## 2021-06-20 ENCOUNTER — Other Ambulatory Visit: Payer: Self-pay | Admitting: Family Medicine

## 2021-06-20 DIAGNOSIS — G43909 Migraine, unspecified, not intractable, without status migrainosus: Secondary | ICD-10-CM

## 2021-07-19 ENCOUNTER — Ambulatory Visit: Payer: BC Managed Care – PPO | Admitting: Family Medicine

## 2021-07-19 ENCOUNTER — Encounter: Payer: Self-pay | Admitting: Family Medicine

## 2021-07-19 VITALS — BP 130/80 | HR 74 | Temp 98.5°F | Resp 16 | Wt 259.0 lb

## 2021-07-19 DIAGNOSIS — J4521 Mild intermittent asthma with (acute) exacerbation: Secondary | ICD-10-CM | POA: Diagnosis not present

## 2021-07-19 DIAGNOSIS — R051 Acute cough: Secondary | ICD-10-CM | POA: Diagnosis not present

## 2021-07-19 DIAGNOSIS — Z6841 Body Mass Index (BMI) 40.0 and over, adult: Secondary | ICD-10-CM

## 2021-07-19 DIAGNOSIS — J301 Allergic rhinitis due to pollen: Secondary | ICD-10-CM

## 2021-07-19 DIAGNOSIS — F325 Major depressive disorder, single episode, in full remission: Secondary | ICD-10-CM

## 2021-07-19 MED ORDER — PREDNISONE 20 MG PO TABS: 20.0000 mg | ORAL_TABLET | Freq: Every day | ORAL | 0 refills | Status: DC

## 2021-07-19 MED ORDER — HYDROCODONE BIT-HOMATROP MBR 5-1.5 MG/5ML PO SOLN: 5.0000 mL | Freq: Three times a day (TID) | ORAL | 0 refills | Status: DC | PRN

## 2021-07-19 MED ORDER — AZITHROMYCIN 250 MG PO TABS: ORAL_TABLET | ORAL | 0 refills | Status: AC

## 2021-07-19 NOTE — Progress Notes (Signed)
Established patient visit  I,Carolyn Walton,acting as a scribe for Megan Mans, MD.,have documented all relevant documentation on the behalf of Megan Mans, MD,as directed by  Megan Mans, MD while in the presence of Megan Mans, MD.    Patient: Carolyn Walton   DOB: [birth year]   60 y.o. Female  MRN: [id number] Visit Date: 07/19/2021  Today's healthcare provider: Megan Mans, MD   Chief Complaint  Patient presents with   Chest Congestion   Subjective    HPI  Patient complains of chest congestion for the past 2 weeks. Also has symptoms of non-productive cough, shortness of breath. Has taken several different otc medications with no relief. Patient with long history of asthma/reactive airway disease, and with 3 weeks of myalgias fatigue and cough that has become productive.  No documented fever.  Mom shortness of breath with a cough.  She notices a lot of wheezing lately.   Outpatient Medications Prior to Visit  Medication Sig   ADVAIR DISKUS 250-50 MCG/ACT AEPB Inhale 1 puff into the lungs daily.   albuterol (VENTOLIN HFA) 108 (90 Base) MCG/ACT inhaler Inhale 2 puffs into the lungs every 4 (four) hours as needed for wheezing or shortness of breath.   buPROPion (WELLBUTRIN XL) 150 MG 24 hr tablet Take 1 tablet (150 mg total) by mouth daily.   cetirizine (ZYRTEC) 10 MG tablet Take 10 mg by mouth daily.   citalopram (CELEXA) 20 MG tablet    montelukast (SINGULAIR) 10 MG tablet Take 1 tablet (10 mg total) by mouth at bedtime.   Semaglutide-Weight Management (WEGOVY) 0.5 MG/0.5ML SOAJ Inject 0.5 mg into the skin once a week.   sertraline (ZOLOFT) 100 MG tablet Take 1 tablet (100 mg total) by mouth daily.   SUMAtriptan (IMITREX) 100 MG tablet Take 1 tablet (100 mg total) by mouth daily. May repeat in 2 hours if headache persists or recurs.   topiramate (TOPAMAX) 200 MG tablet Take 1 tablet (200 mg total) by mouth daily.   No facility-administered  medications prior to visit.    Review of Systems  Last hemoglobin A1c Lab Results  Component Value Date   HGBA1C 5.1 04/13/2021       Objective    BP 130/80 (BP Location: Right Arm, Patient Position: Sitting, Cuff Size: Large)   Pulse 74   Temp 98.5 F (36.9 C) (Oral)   Resp 16   Wt 259 lb (117.5 kg)   SpO2 98%   BMI 50.58 kg/m  BP Readings from Last 3 Encounters:  07/19/21 130/80  06/13/21 138/82  04/13/21 (!) 150/67   Wt Readings from Last 3 Encounters:  07/19/21 259 lb (117.5 kg)  06/13/21 264 lb (119.7 kg)  04/13/21 270 lb 8 oz (122.7 kg)      Physical Exam Vitals reviewed.  Constitutional:      General: She is not in acute distress.    Appearance: She is well-developed. She is obese.  HENT:     Head: Normocephalic and atraumatic.     Right Ear: Hearing normal.     Left Ear: Hearing normal.     Nose: Nose normal.  Eyes:     General: Lids are normal. No scleral icterus.       Right eye: No discharge.        Left eye: No discharge.     Conjunctiva/sclera: Conjunctivae normal.  Cardiovascular:     Rate and Rhythm: Normal rate and regular rhythm.  Pulses: Normal pulses.     Heart sounds: Normal heart sounds.  Pulmonary:     Effort: Pulmonary effort is normal. No respiratory distress.     Breath sounds: Wheezing present.  Skin:    Findings: No lesion or rash.  Neurological:     General: No focal deficit present.     Mental Status: She is alert and oriented to person, place, and time.  Psychiatric:        Mood and Affect: Mood normal.        Speech: Speech normal.        Behavior: Behavior normal.        Thought Content: Thought content normal.        Judgment: Judgment normal.       No results found for any visits on 07/19/21.  Assessment & Plan     1. Mild intermittent asthmatic bronchitis with acute exacerbation Treat with Z-Pak prednisone and inhaler  2. Seasonal allergic rhinitis due to pollen   3. Acute cough Treat with Hycodan  as needed  4. BMI 50.0-59.9, adult [facility]) Patient has lost 11 pounds in 3 months.  Continue to work on habits  5. Major depressive disorder with single episode, in full remission (HCC) Markedly improved.   No follow-ups on file.      I, Megan Mans, MD, have reviewed all documentation for this visit. The documentation on 07/21/21 for the exam, diagnosis, procedures, and orders are all accurate and complete.    Josely Moffat Wendelyn Breslow, MD  [facility] [phone] (phone) [phone] (fax)  [facility]

## 2021-08-03 ENCOUNTER — Ambulatory Visit: Payer: Self-pay

## 2021-08-03 NOTE — Telephone Encounter (Signed)
Summary: Persistent cough for a month, needs advice   Pt called reporting that she has had a cough for a month. Please advise  Best contact: [phone]      Returned pt's call - [facility]

## 2021-08-03 NOTE — Telephone Encounter (Signed)
  Chief Complaint: cough Symptoms: continuous productive cough with green mucus, SOB at times, HA Frequency: 1 month Pertinent Negatives: NA Disposition: [] ED /[] Urgent Care (no appt availability in office) / [x] Appointment(In office/virtual)/ []  [facility]/ [] Home Care/ [] Refused Recommended Disposition /[] [place]/ []  Follow-up with PCP Additional Notes: pt was seen on 07/19/21 for this and was given several different meds and states that she thought the cough was getting better but its not. Pt has had to use inhaler several times today for SOB and gets minimal relief with use. Advised pt to schedule a fu appt with Dr. . Appt scheduled for 08/04/21 at1400.   Summary: Persistent cough for a month, needs advice   Pt called reporting that she has had a cough for a month. Please advise  Best contact: [phone]      Reason for Disposition  [1] Continuous (nonstop) coughing interferes with work or school AND [2] no improvement using cough treatment per Care Advice  Answer Assessment - Initial Assessment Questions 1. ONSET: "When did the cough begin?"      1 month 2. SEVERITY: "How bad is the cough today?"      Gotten worse  3. SPUTUM: "Describe the color of your sputum" (none, dry cough; clear, white, yellow, green)     Green thick  5. DIFFICULTY BREATHING: "Are you having difficulty breathing?" If Yes, ask: "How bad is it?" (e.g., mild, moderate, severe)    - MILD: No SOB at rest, mild SOB with walking, speaks normally in sentences, can lie down, no retractions, pulse < 100.    - MODERATE: SOB at rest, SOB with minimal exertion and prefers to sit, cannot lie down flat, speaks in phrases, mild retractions, audible wheezing, pulse 100-120.    - SEVERE: Very SOB at rest, speaks in single words, struggling to breathe, sitting hunched forward, retractions, pulse > 120      SOB several time today  8. LUNG HISTORY: "Do you have any history of lung disease?"  (e.g.,  pulmonary embolus, asthma, emphysema)     asthma 10. OTHER SYMPTOMS: "Do you have any other symptoms?" (e.g., runny nose, wheezing, chest pain)       HA  Protocols used: Cough - Acute Productive-A-AH

## 2021-08-04 ENCOUNTER — Encounter: Payer: Self-pay | Admitting: Family Medicine

## 2021-08-04 ENCOUNTER — Ambulatory Visit: Payer: BC Managed Care – PPO | Admitting: Family Medicine

## 2021-08-04 VITALS — BP 153/82 | HR 72 | Resp 16

## 2021-08-04 DIAGNOSIS — J4541 Moderate persistent asthma with (acute) exacerbation: Secondary | ICD-10-CM | POA: Diagnosis not present

## 2021-08-04 MED ORDER — PREDNISONE 10 MG (21) PO TBPK: ORAL_TABLET | ORAL | 0 refills | Status: DC

## 2021-08-04 MED ORDER — DOXYCYCLINE HYCLATE 100 MG PO TABS: 100.0000 mg | ORAL_TABLET | Freq: Two times a day (BID) | ORAL | 0 refills | Status: DC

## 2021-08-04 NOTE — Progress Notes (Signed)
Established patient visit   Patient: Carolyn Walton   DOB: Apr 30, 1960   61 y.o. Female  MRN: [id number] Visit Date: 08/04/2021  Today's healthcare provider: Megan Mans, MD   Chief Complaint  Patient presents with   Cough   Subjective    HPI  Continues with cough and wheezing. No shortness of breath no hemoptysis no fever.  Follow up for Acute cough   The patient was last seen for this 2 months ago. Changes made at last visit include; Treated with Hycodan as needed.  ----------------------------------------------------------------------------------------- Patient completed medications. However she still has lingering cough. Mildly productive with green mucous.   Medications: Outpatient Medications Prior to Visit  Medication Sig   ADVAIR DISKUS 250-50 MCG/ACT AEPB Inhale 1 puff into the lungs daily.   albuterol (VENTOLIN HFA) 108 (90 Base) MCG/ACT inhaler Inhale 2 puffs into the lungs every 4 (four) hours as needed for wheezing or shortness of breath.   buPROPion (WELLBUTRIN XL) 150 MG 24 hr tablet Take 1 tablet (150 mg total) by mouth daily.   cetirizine (ZYRTEC) 10 MG tablet Take 10 mg by mouth daily.   citalopram (CELEXA) 20 MG tablet    HYDROcodone bit-homatropine (HYCODAN) 5-1.5 MG/5ML syrup Take 5 mLs by mouth every 8 (eight) hours as needed for cough.   montelukast (SINGULAIR) 10 MG tablet Take 1 tablet (10 mg total) by mouth at bedtime.   predniSONE (DELTASONE) 20 MG tablet Take 1 tablet (20 mg total) by mouth daily with breakfast.   Semaglutide-Weight Management (WEGOVY) 0.5 MG/0.5ML SOAJ Inject 0.5 mg into the skin once a week.   sertraline (ZOLOFT) 100 MG tablet Take 1 tablet (100 mg total) by mouth daily.   SUMAtriptan (IMITREX) 100 MG tablet Take 1 tablet (100 mg total) by mouth daily. May repeat in 2 hours if headache persists or recurs.   topiramate (TOPAMAX) 200 MG tablet Take 1 tablet (200 mg total) by mouth daily.   No facility-administered  medications prior to visit.    Review of Systems  Constitutional:  Negative for appetite change, chills, fatigue and fever.  Respiratory:  Positive for cough. Negative for chest tightness and shortness of breath.   Cardiovascular:  Negative for chest pain and palpitations.  Gastrointestinal:  Negative for abdominal pain, nausea and vomiting.  Neurological:  Negative for dizziness and weakness.       Objective    BP (!) 153/82 (BP Location: Right Arm, Patient Position: Sitting, Cuff Size: Large)   Pulse 72   Resp 16   SpO2 99%    Physical Exam Vitals reviewed.  Constitutional:      General: She is not in acute distress.    Appearance: She is well-developed.  HENT:     Head: Normocephalic and atraumatic.     Right Ear: Hearing normal.     Left Ear: Hearing normal.     Nose: Nose normal.  Eyes:     General: Lids are normal. No scleral icterus.       Right eye: No discharge.        Left eye: No discharge.     Conjunctiva/sclera: Conjunctivae normal.  Cardiovascular:     Rate and Rhythm: Normal rate and regular rhythm.     Heart sounds: Normal heart sounds.  Pulmonary:     Effort: Pulmonary effort is normal. No respiratory distress.     Breath sounds: Wheezing present.  Skin:    Findings: No lesion or rash.  Neurological:  General: No focal deficit present.     Mental Status: She is alert and oriented to person, place, and time.  Psychiatric:        Mood and Affect: Mood normal.        Speech: Speech normal.        Behavior: Behavior normal.        Thought Content: Thought content normal.        Judgment: Judgment normal.       No results found for any visits on 08/04/21.  Assessment & Plan     1. Moderate persistent asthmatic bronchitis with acute exacerbation Doxycycline for 5 days and prednisone 10 mg 6-day taper refer to pulmonology - Ambulatory referral to Pulmonology   No follow-ups on file.      I, Megan Mans, MD, have reviewed all  documentation for this visit. The documentation on 08/09/21 for the exam, diagnosis, procedures, and orders are all accurate and complete.    Carolyn Walton Wendelyn Breslow, MD  [facility] [phone] (phone) [phone] (fax)  [facility]

## 2021-08-08 ENCOUNTER — Other Ambulatory Visit: Payer: Self-pay | Admitting: Family Medicine

## 2021-08-08 DIAGNOSIS — Z6841 Body Mass Index (BMI) 40.0 and over, adult: Secondary | ICD-10-CM

## 2021-08-13 ENCOUNTER — Other Ambulatory Visit: Payer: Self-pay | Admitting: Family Medicine

## 2021-08-17 ENCOUNTER — Ambulatory Visit: Payer: BC Managed Care – PPO | Admitting: Student in an Organized Health Care Education/Training Program

## 2021-08-17 ENCOUNTER — Encounter: Payer: Self-pay | Admitting: Student in an Organized Health Care Education/Training Program

## 2021-08-17 ENCOUNTER — Other Ambulatory Visit
Admission: RE | Admit: 2021-08-17 | Discharge: 2021-08-17 | Disposition: A | Discharge status: Home / Self Care | Payer: BC Managed Care – PPO | Attending: Student in an Organized Health Care Education/Training Program | Admitting: Student in an Organized Health Care Education/Training Program

## 2021-08-17 VITALS — BP 124/72 | HR 73 | Temp 98.0°F | Ht 60.0 in | Wt 262.2 lb

## 2021-08-17 DIAGNOSIS — J301 Allergic rhinitis due to pollen: Secondary | ICD-10-CM | POA: Insufficient documentation

## 2021-08-17 DIAGNOSIS — J4540 Moderate persistent asthma, uncomplicated: Secondary | ICD-10-CM | POA: Diagnosis not present

## 2021-08-17 DIAGNOSIS — R918 Other nonspecific abnormal finding of lung field: Secondary | ICD-10-CM | POA: Diagnosis not present

## 2021-08-17 LAB — CBC WITH DIFFERENTIAL/PLATELET
Abs Immature Granulocytes: 0.06 10*3/uL (ref 0.00–0.07)
Basophils Absolute: 0 10*3/uL (ref 0.0–0.1)
Basophils Relative: 1 %
Eosinophils Absolute: 0.4 10*3/uL (ref 0.0–0.5)
Eosinophils Relative: 5 %
HCT: 43.7 % (ref 36.0–46.0)
Hemoglobin: 13.9 g/dL (ref 12.0–15.0)
Immature Granulocytes: 1 %
Lymphocytes Relative: 28 %
Lymphs Abs: 2.1 10*3/uL (ref 0.7–4.0)
MCH: 28.2 pg (ref 26.0–34.0)
MCHC: 31.8 g/dL (ref 30.0–36.0)
MCV: 88.6 fL (ref 80.0–100.0)
Monocytes Absolute: 0.5 10*3/uL (ref 0.1–1.0)
Monocytes Relative: 7 %
Neutro Abs: 4.4 10*3/uL (ref 1.7–7.7)
Neutrophils Relative %: 58 %
Platelets: 170 10*3/uL (ref 150–400)
RBC: 4.93 MIL/uL (ref 3.87–5.11)
RDW: 13.5 % (ref 11.5–15.5)
WBC: 7.6 10*3/uL (ref 4.0–10.5)
nRBC: 0 % (ref 0.0–0.2)

## 2021-08-17 MED ORDER — FLUTICASONE PROPIONATE 50 MCG/ACT NA SUSP: 2.0000 | Freq: Every day | NASAL | 0 refills | Status: DC

## 2021-08-17 NOTE — Patient Instructions (Addendum)
Today, we discussed your asthma control. I would recommend that you take your advair twice daily, as close to 6 am and 6 pm as possible. I will prescribe you flonase, use two sprays per nostril daily I will order blood work to evaluate your allergies I have ordered a breathing test (PFT) to evaluate your breathing I will also repeat your CT chest given the history of nodules previously

## 2021-08-17 NOTE — Progress Notes (Signed)
Synopsis: Referred in for management of asthma by Maple Hudson.,*  Assessment & Plan:   #Moderate Persistent Asthma   Has a history of asthma and is maintained on Advair. Asthma is suboptimally controlled as evidenced by shortness of breath and scattered wheezing. Inhaler use and compliance stressed today. We discussed strategies to optimize use of advair and minimize side effects (inability to sleep). Suggested using Advair earlier in the evening, in addition to continuing Montelukast as well as PRN albuterol.  Furthermore, on exam she does exhibit signs of allergic rhinitis for which I have initiated Flonase (2 puffs in each nostril once daily). Ms. Rotz also reports reflux which could be contributing to her asthma symptoms. Today, we discussed optimizing diet to GERD, including minimizing coffee, tomatoes, spicy foods, and deep fried foods. She is understanding and will attempt to optimize diet. She is also attempting to loose weight which will help with asthma control.  As for workup of Asthma, I will attempt to workup her phenotype by obtaining a CBC (resulted with eosinophil level of 400), an allergen panel, an IgE level, and a pulmonary function test (spirometry, lung volumes, and DLCO). Poorly controlled asthma, ABPA/ABPM, and TBM are on my differential for her symptoms.  -PFT -Continue Advair 250/50 two puffs bid -Continue Albuterol as needed every 4 hours -CBC w/ differential -Allergen panel with IgE  #Pulmonary nodules  Patient tells me that she had an imaging study a year ago at an outside facility and was told she has pulmonary nodules requiring follow up in a year. She has not had repeat imaging since then. I will order a CT scan of the chest to investigate.  -CT Chest with out contrast.   I spent 60 minutes caring for this patient today, including preparing to see the patient, obtaining and/or reviewing separately obtained history, performing a medically  appropriate examination and/or evaluation, counseling and educating the patient/family/caregiver, ordering medications, tests, or procedures, and documenting clinical information in the electronic health record  Return in about 5 weeks (around 09/21/2021).  End of visit medications:  Current Outpatient Medications:    ADVAIR DISKUS 250-50 MCG/ACT AEPB, Inhale 1 puff into the lungs daily., Disp: 60 each, Rfl: 2   albuterol (VENTOLIN HFA) 108 (90 Base) MCG/ACT inhaler, Inhale 2 puffs into the lungs every 4 (four) hours as needed for wheezing or shortness of breath., Disp: 18 g, Rfl: 12   buPROPion (WELLBUTRIN XL) 150 MG 24 hr tablet, Take 1 tablet (150 mg total) by mouth daily., Disp: 30 tablet, Rfl: 12   cetirizine (ZYRTEC) 10 MG tablet, Take 10 mg by mouth daily., Disp: , Rfl:    citalopram (CELEXA) 20 MG tablet, , Disp: , Rfl:    fluticasone (FLONASE) 50 MCG/ACT nasal spray, Place 2 sprays into both nostrils daily., Disp: 1 g, Rfl: 0   HYDROcodone bit-homatropine (HYCODAN) 5-1.5 MG/5ML syrup, Take 5 mLs by mouth every 8 (eight) hours as needed for cough., Disp: 120 mL, Rfl: 0   montelukast (SINGULAIR) 10 MG tablet, Take 1 tablet (10 mg total) by mouth at bedtime., Disp: 90 tablet, Rfl: 3   sertraline (ZOLOFT) 100 MG tablet, Take 1 tablet (100 mg total) by mouth daily., Disp: 90 tablet, Rfl: 1   SUMAtriptan (IMITREX) 100 MG tablet, Take 1 tablet (100 mg total) by mouth daily. May repeat in 2 hours if headache persists or recurs., Disp: 9 tablet, Rfl: 5   topiramate (TOPAMAX) 200 MG tablet, Take 1 tablet (200 mg total) by  mouth daily., Disp: 90 tablet, Rfl: 3   Semaglutide-Weight Management (WEGOVY) 0.5 MG/0.5ML SOAJ, Inject 0.5 mg into the skin once a week. (Patient not taking: Reported on 08/17/2021), Disp: 2 mL, Rfl: 2   Armando Reichert, MD [facility] 08/17/2021 3:06 PM    Subjective:   PATIENT ID: Carolyn Walton GENDER: female DOB: 28-Jun-1960, MRN: [id number]  Chief  Complaint  Patient presents with   pulmonary consult    Per Dr. Rosanna Randy for asthmatic bronchitis. SOB with exertion, wheezing and prod with white to green sputum with deep breathing. Completed course of prednisone and doxy 2 weeks ago.     HPI  Ms. Zellman is a pleasant 61 year old female with a history of asthma (reports having the diagnosis placed in her 56s) who presents to clinic now with a chief complaint of shortness of breath and cough.  Patient reports that her most recent asthma exacerbation was the week of July 4th from which she has never fully recovered.  She reports an increase in cough with scant whitish mucus production.  She denies chest pain.  She does report shortness of breath with exertion.  She does not have any fevers or chills but does endorse chills the week of July 4.  She is maintained on Advair 250/50 and tells me that she uses it only once daily.  She did have to use albuterol Monday but has not had to use it yesterday or today.  She also reports a runny nose and a scratchy sensation in the back of her throat. She reports that she is not able to use the advair at night because it keeps her up all night and doesn't allow her to sleep. She has been having more reflux recently, and she does endorse enjoying coffee, tomatoes, and spicy food.  She has 2 dogs, 1 of which sheds heavily. She allows the dogs in her bedroom but not in her bed.  She does not have any carpet in her house and does not endorse having any mold.  She tells me that she has significant sensitivity to mold and will cough whenever exposed to it.  She works as a Public relations account executive.  She does not smoke or vape and never has.  She does report a history of reflux.  Ancillary information including prior medications, full medical/surgical/family/social histoies, and PFTs (when available) are listed below and have been reviewed.   Review of Systems  Constitutional:  Negative for chills and fever.  HENT:  Negative  for sinus pain.   Respiratory:  Positive for cough, sputum production, shortness of breath and wheezing. Negative for hemoptysis.   Cardiovascular:  Negative for chest pain.  Gastrointestinal:  Negative for abdominal pain.     Objective:   Vitals:   08/17/21 1049  BP: 124/72  Pulse: 73  Temp: 98 F (36.7 C)  TempSrc: Temporal  SpO2: 97%  Weight: 262 lb 3.2 oz (118.9 kg)  Height: 5' (1.524 m)   97% on RA BMI Readings from Last 3 Encounters:  08/17/21 51.21 kg/m  07/19/21 50.58 kg/m  06/13/21 51.56 kg/m   Wt Readings from Last 3 Encounters:  08/17/21 262 lb 3.2 oz (118.9 kg)  07/19/21 259 lb (117.5 kg)  06/13/21 264 lb (119.7 kg)    Physical Exam Constitutional:      Appearance: Normal appearance. She is obese.  HENT:     Head: Normocephalic and atraumatic.     Nose: Congestion (inflammed turbinates) present.  Neurological:  Mental Status: She is alert.       Ancillary Information    Past Medical History:  Diagnosis Date   Allergy    Arthritis    Asthma    Frequent headaches      No family history on file.   Past Surgical History:  Procedure Laterality Date   CESAREAN SECTION     times 2   KNEE ARTHROSCOPY WITH MEDIAL MENISECTOMY Left 06/17/2014   Procedure: KNEE ARTHROSCOPY WITH MEDIAL MENISECTOMY;  Surgeon: Juanell Fairly, MD;  Location: [facility] ORS;  Service: Orthopedics;  Laterality: Left;  partial medial menisectomy    Social History   Socioeconomic History   Marital status: Divorced    Spouse name: Not on file   Number of children: Not on file   Years of education: Not on file   Highest education level: Not on file  Occupational History   Not on file  Tobacco Use   Smoking status: Never   Smokeless tobacco: Never  Vaping Use   Vaping Use: Never used  Substance and Sexual Activity   Alcohol use: Yes    Comment: wine occasionally   Drug use: No   Sexual activity: Yes    Birth control/protection: Post-menopausal, None  Other  Topics Concern   Not on file  Social History Narrative   Not on file   Social Determinants of Health   Financial Resource Strain: Not on file  Food Insecurity: Not on file  Transportation Needs: Not on file  Physical Activity: Not on file  Stress: Not on file  Social Connections: Not on file  Intimate Partner Violence: Not on file     Allergies  Allergen Reactions   Molds & Smuts Other (See Comments)    Sneezing   Other Other (See Comments)    CATS AND DOGS     CBC    Component Value Date/Time   WBC 7.6 08/17/2021 1145   RBC 4.93 08/17/2021 1145   HGB 13.9 08/17/2021 1145   HGB 14.2 04/13/2021 1051   HCT 43.7 08/17/2021 1145   HCT 43.8 04/13/2021 1051   PLT 170 08/17/2021 1145   PLT 167 04/13/2021 1051   MCV 88.6 08/17/2021 1145   MCV 87 04/13/2021 1051   MCV 88 10/10/2013 1131   MCH 28.2 08/17/2021 1145   MCHC 31.8 08/17/2021 1145   RDW 13.5 08/17/2021 1145   RDW 12.8 04/13/2021 1051   RDW 13.5 10/10/2013 1131   LYMPHSABS 2.1 08/17/2021 1145   LYMPHSABS 1.9 04/13/2021 1051   LYMPHSABS 1.7 10/10/2013 1131   MONOABS 0.5 08/17/2021 1145   MONOABS 0.4 10/10/2013 1131   EOSABS 0.4 08/17/2021 1145   EOSABS 0.2 04/13/2021 1051   EOSABS 0.3 10/10/2013 1131   BASOSABS 0.0 08/17/2021 1145   BASOSABS 0.0 04/13/2021 1051   BASOSABS 0.0 10/10/2013 1131    Pulmonary Functions Testing Results:     No data to display          Outpatient Medications Prior to Visit  Medication Sig Dispense Refill   ADVAIR DISKUS 250-50 MCG/ACT AEPB Inhale 1 puff into the lungs daily. 60 each 2   albuterol (VENTOLIN HFA) 108 (90 Base) MCG/ACT inhaler Inhale 2 puffs into the lungs every 4 (four) hours as needed for wheezing or shortness of breath. 18 g 12   buPROPion (WELLBUTRIN XL) 150 MG 24 hr tablet Take 1 tablet (150 mg total) by mouth daily. 30 tablet 12   cetirizine (ZYRTEC) 10 MG tablet Take 10 mg  by mouth daily.     citalopram (CELEXA) 20 MG tablet      HYDROcodone  bit-homatropine (HYCODAN) 5-1.5 MG/5ML syrup Take 5 mLs by mouth every 8 (eight) hours as needed for cough. 120 mL 0   montelukast (SINGULAIR) 10 MG tablet Take 1 tablet (10 mg total) by mouth at bedtime. 90 tablet 3   sertraline (ZOLOFT) 100 MG tablet Take 1 tablet (100 mg total) by mouth daily. 90 tablet 1   SUMAtriptan (IMITREX) 100 MG tablet Take 1 tablet (100 mg total) by mouth daily. May repeat in 2 hours if headache persists or recurs. 9 tablet 5   topiramate (TOPAMAX) 200 MG tablet Take 1 tablet (200 mg total) by mouth daily. 90 tablet 3   doxycycline (VIBRA-TABS) 100 MG tablet Take 1 tablet (100 mg total) by mouth 2 (two) times daily. 10 tablet 0   predniSONE (STERAPRED UNI-PAK 21 TAB) 10 MG (21) TBPK tablet Taper over 6 days-6 - 5 - 4- 3 - 2 - 1 21 tablet 0   Semaglutide-Weight Management (WEGOVY) 0.5 MG/0.5ML SOAJ Inject 0.5 mg into the skin once a week. (Patient not taking: Reported on 08/17/2021) 2 mL 2   No facility-administered medications prior to visit.

## 2021-08-19 LAB — ALLERGEN PANEL (27) + IGE
Alternaria Alternata IgE: <0.1 kU/L
Aspergillus Fumigatus IgE: <0.1 kU/L
Bahia Grass IgE: <0.1 kU/L
Bermuda Grass IgE: <0.1 kU/L
Cat Dander IgE: <0.1 kU/L
Cedar, Mountain IgE: <0.1 kU/L
Cladosporium Herbarum IgE: <0.1 kU/L
Cocklebur IgE: <0.1 kU/L
Cockroach, American IgE: <0.1 kU/L
Common Silver Birch IgE: <0.1 kU/L
D Farinae IgE: <0.1 kU/L
D Pteronyssinus IgE: <0.1 kU/L
Dog Dander IgE: <0.1 kU/L
Elm, American IgE: <0.1 kU/L
Hickory, White IgE: <0.1 kU/L
IgE (Immunoglobulin E), Serum: 8 IU/mL (ref 6–495)
Johnson Grass IgE: <0.1 kU/L
Kentucky Bluegrass IgE: <0.1 kU/L
Maple/Box Elder IgE: <0.1 kU/L
Mucor Racemosus IgE: <0.1 kU/L
Oak, White IgE: <0.1 kU/L
Penicillium Chrysogen IgE: <0.1 kU/L
Pigweed, Rough IgE: <0.1 kU/L
Plantain, English IgE: <0.1 kU/L
Ragweed, Short IgE: <0.1 kU/L
Setomelanomma Rostrat: <0.1 kU/L
Timothy Grass IgE: <0.1 kU/L
White Mulberry IgE: <0.1 kU/L

## 2021-08-24 ENCOUNTER — Ambulatory Visit: Payer: BC Managed Care – PPO

## 2021-09-12 ENCOUNTER — Other Ambulatory Visit: Payer: Self-pay | Admitting: Family Medicine

## 2021-09-12 DIAGNOSIS — F329 Major depressive disorder, single episode, unspecified: Secondary | ICD-10-CM

## 2021-09-15 ENCOUNTER — Other Ambulatory Visit: Payer: Self-pay | Admitting: Student in an Organized Health Care Education/Training Program

## 2021-09-15 DIAGNOSIS — J4540 Moderate persistent asthma, uncomplicated: Secondary | ICD-10-CM

## 2021-09-15 DIAGNOSIS — J301 Allergic rhinitis due to pollen: Secondary | ICD-10-CM

## 2021-09-21 ENCOUNTER — Encounter: Payer: Self-pay | Admitting: Family Medicine

## 2021-09-21 ENCOUNTER — Ambulatory Visit: Payer: BC Managed Care – PPO | Admitting: Family Medicine

## 2021-09-21 VITALS — BP 128/68 | HR 78 | Resp 16 | Ht 60.0 in | Wt 262.0 lb

## 2021-09-21 DIAGNOSIS — J4521 Mild intermittent asthma with (acute) exacerbation: Secondary | ICD-10-CM

## 2021-09-21 DIAGNOSIS — F325 Major depressive disorder, single episode, in full remission: Secondary | ICD-10-CM

## 2021-09-21 DIAGNOSIS — Z6841 Body Mass Index (BMI) 40.0 and over, adult: Secondary | ICD-10-CM | POA: Diagnosis not present

## 2021-09-21 DIAGNOSIS — G43909 Migraine, unspecified, not intractable, without status migrainosus: Secondary | ICD-10-CM | POA: Diagnosis not present

## 2021-09-21 DIAGNOSIS — Z23 Encounter for immunization: Secondary | ICD-10-CM | POA: Diagnosis not present

## 2021-09-21 MED ORDER — SEMAGLUTIDE-WEIGHT MANAGEMENT 1 MG/0.5ML ~~LOC~~ SOAJ: 1.0000 mg | SUBCUTANEOUS | 5 refills | Status: DC

## 2021-09-21 NOTE — Progress Notes (Signed)
Established patient visit  I,Carolyn Walton,acting as a scribe for Carolyn Durie, MD.,have documented all relevant documentation on the behalf of Carolyn Durie, MD,as directed by  Carolyn Durie, MD while in the presence of Carolyn Durie, MD.   Patient: Carolyn Walton   DOB: [birth year]   62 y.o. Female  MRN: [id number] Visit Date: 09/21/2021  Today's healthcare provider: Wilhemena Durie, MD   Chief Complaint  Patient presents with   Follow-up   Depression   Obesity   Subjective    HPI  Overall patient feeling well.  She is tolerating room wegovy well and having no side effects. She has lost 0 pounds in 5 weeks. Follow up for obesity  The patient was last seen for this 3 months ago. Changes made at last visit include increase Wegovy to 0.5mg  weekly.  ----------------------------------------------------------------------------------------- Depression, Follow-up  She  was last seen for this 3 months ago. Changes made at last visit include continue Zoloft and wellbutrin.      06/13/2021    4:23 PM 07/22/2020    3:13 PM 07/22/2020    3:12 PM  Depression screen PHQ 2/9  Decreased Interest 0 0 0  Down, Depressed, Hopeless 0 1 1  PHQ - 2 Score 0 1 1  Altered sleeping 3 1   Tired, decreased energy 2 0   Change in appetite 1 1   Feeling bad or failure about yourself  0 1   Trouble concentrating 0 0   Moving slowly or fidgety/restless 0 0   Suicidal thoughts 0 0   PHQ-9 Score 6 4   Difficult doing work/chores Not difficult at all Not difficult at all     -----------------------------------------------------------------------------------------   Medications: Outpatient Medications Prior to Visit  Medication Sig   ADVAIR DISKUS 250-50 MCG/ACT AEPB Inhale 1 puff into the lungs daily.   albuterol (VENTOLIN HFA) 108 (90 Base) MCG/ACT inhaler Inhale 2 puffs into the lungs every 4 (four) hours as needed for wheezing or shortness of breath.    buPROPion (WELLBUTRIN XL) 150 MG 24 hr tablet Take 1 tablet (150 mg total) by mouth daily.   cetirizine (ZYRTEC) 10 MG tablet Take 10 mg by mouth daily.   citalopram (CELEXA) 20 MG tablet    fluticasone (FLONASE) 50 MCG/ACT nasal spray Place 2 sprays into both nostrils daily.   HYDROcodone bit-homatropine (HYCODAN) 5-1.5 MG/5ML syrup Take 5 mLs by mouth every 8 (eight) hours as needed for cough.   montelukast (SINGULAIR) 10 MG tablet Take 1 tablet (10 mg total) by mouth at bedtime.   sertraline (ZOLOFT) 100 MG tablet Take 1 tablet (100 mg total) by mouth daily.   SUMAtriptan (IMITREX) 100 MG tablet Take 1 tablet (100 mg total) by mouth daily. May repeat in 2 hours if headache persists or recurs.   topiramate (TOPAMAX) 200 MG tablet Take 1 tablet (200 mg total) by mouth daily.   Semaglutide-Weight Management (WEGOVY) 0.5 MG/0.5ML SOAJ Inject 0.5 mg into the skin once a week. (Patient not taking: Reported on 08/17/2021)   No facility-administered medications prior to visit.    Review of Systems  Last thyroid functions Lab Results  Component Value Date   TSH 3.300 04/13/2021       Objective    BP 128/68 (BP Location: Left Arm, Patient Position: Sitting, Cuff Size: Large)   Pulse 78   Resp 16   Ht 5' (1.524 m)   Wt 262 lb (118.8 kg)  SpO2 100%   BMI 51.17 kg/m  BP Readings from Last 3 Encounters:  09/21/21 128/68  08/17/21 124/72  08/04/21 (!) 153/82   Wt Readings from Last 3 Encounters:  09/21/21 262 lb (118.8 kg)  08/17/21 262 lb 3.2 oz (118.9 kg)  07/19/21 259 lb (117.5 kg)      Physical Exam Vitals reviewed.  Constitutional:      General: She is not in acute distress.    Appearance: She is well-developed.  HENT:     Head: Normocephalic and atraumatic.     Right Ear: Hearing normal.     Left Ear: Hearing normal.     Nose: Nose normal.  Eyes:     General: Lids are normal. No scleral icterus.       Right eye: No discharge.        Left eye: No discharge.      Conjunctiva/sclera: Conjunctivae normal.  Cardiovascular:     Rate and Rhythm: Normal rate and regular rhythm.     Heart sounds: Normal heart sounds.  Pulmonary:     Effort: Pulmonary effort is normal. No respiratory distress.  Skin:    Findings: No lesion or rash.  Neurological:     General: No focal deficit present.     Mental Status: She is alert and oriented to person, place, and time.  Psychiatric:        Mood and Affect: Mood normal.        Speech: Speech normal.        Behavior: Behavior normal.        Thought Content: Thought content normal.        Judgment: Judgment normal.       No results found for any visits on 09/21/21.  Assessment & Plan     1. BMI 50.0-59.9, adult (HCC) Wegovy to 1 mg dose.   F/u In office in 2 months.  To address response to [facility]. - Semaglutide-Weight Management 1 MG/0.5ML SOAJ; Inject 1 mg into the skin once a week.  Dispense: 2 mL; Refill: 5  2. Major depressive disorder with single episode, in full remission ([place]) Present meds at present dose indefinitely. Much improved  3. Need for immunization against influenza  - Flu Vaccine QUAD 41mo+IM (Fluarix, Fluzone & Alfiuria Quad PF)  4. Mild intermittent asthmatic bronchitis with acute exacerbation Controlled. on present meds.  5. Migraine without status migrainosus, not intractable, unspecified migraine type    No follow-ups on file.      I, Carolyn Durie, MD, have reviewed all documentation for this visit. The documentation on 09/25/21 for the exam, diagnosis, procedures, and orders are all accurate and complete.    Carolyn Walton Mon, MD  [facility] [phone] (phone) [phone] (fax)  [place]

## 2021-09-23 ENCOUNTER — Ambulatory Visit: Payer: BC Managed Care – PPO | Attending: Student in an Organized Health Care Education/Training Program

## 2021-09-23 DIAGNOSIS — J301 Allergic rhinitis due to pollen: Secondary | ICD-10-CM | POA: Insufficient documentation

## 2021-09-23 DIAGNOSIS — J4540 Moderate persistent asthma, uncomplicated: Secondary | ICD-10-CM | POA: Insufficient documentation

## 2021-09-23 LAB — PULMONARY FUNCTION TEST ARMC ONLY
DL/VA % pred: 109 %
DL/VA: 4.75 ml/min/mmHg/L
DLCO unc % pred: 125 %
DLCO unc: 22.05 ml/min/mmHg
FEF 25-75 Post: 3.42 L/sec
FEF 25-75 Pre: 1.87 L/sec
FEF2575-%Change-Post: 83 %
FEF2575-%Pred-Post: 160 %
FEF2575-%Pred-Pre: 87 %
FEV1-%Change-Post: 15 %
FEV1-%Pred-Post: 104 %
FEV1-%Pred-Pre: 90 %
FEV1-Post: 2.28 L
FEV1-Pre: 1.97 L
FEV1FVC-%Change-Post: 5 %
FEV1FVC-%Pred-Pre: 104 %
FEV6-%Change-Post: 9 %
FEV6-%Pred-Post: 97 %
FEV6-%Pred-Pre: 89 %
FEV6-Post: 2.65 L
FEV6-Pre: 2.43 L
FEV6FVC-%Pred-Post: 103 %
FEV6FVC-%Pred-Pre: 103 %
FVC-%Change-Post: 9 %
FVC-%Pred-Post: 93 %
FVC-%Pred-Pre: 85 %
FVC-Post: 2.65 L
FVC-Pre: 2.43 L
Post FEV1/FVC ratio: 86 %
Post FEV6/FVC ratio: 100 %
Pre FEV1/FVC ratio: 81 %
Pre FEV6/FVC Ratio: 100 %
RV % pred: 81 %
RV: 1.45 L
TLC % pred: 102 %
TLC: 4.56 L

## 2021-09-23 MED ORDER — ALBUTEROL SULFATE (2.5 MG/3ML) 0.083% IN NEBU
2.5000 mg | INHALATION_SOLUTION | Freq: Once | RESPIRATORY_TRACT | Status: AC
  Administered 2021-09-23: 2.5 mg via RESPIRATORY_TRACT
  Filled 2021-09-23: qty 3

## 2021-09-26 ENCOUNTER — Ambulatory Visit (INDEPENDENT_AMBULATORY_CARE_PROVIDER_SITE_OTHER): Payer: BC Managed Care – PPO | Admitting: Student in an Organized Health Care Education/Training Program

## 2021-09-26 ENCOUNTER — Encounter: Payer: Self-pay | Admitting: Student in an Organized Health Care Education/Training Program

## 2021-09-26 VITALS — BP 122/70 | HR 80 | Temp 98.1°F | Ht 60.0 in | Wt 261.8 lb

## 2021-09-26 DIAGNOSIS — J4540 Moderate persistent asthma, uncomplicated: Secondary | ICD-10-CM

## 2021-09-26 LAB — NITRIC OXIDE: Nitric Oxide: 11

## 2021-09-26 NOTE — Progress Notes (Signed)
Synopsis: Referred in for shortness of breath and asthma by Maple Hudson.,*  Assessment & Plan:   1. Moderate persistent asthma without complication  Patient has a history of asthma and is maintained on Advair. PFT's showed normal spirometry but with significant response to bronchodilators. PFT's also notable for a very low ERV, which could contribute to her symptoms. Workup has included an allergen test that was negative and a CBC with differential with an eosinophil count of 400. Overall, data is consistent with a diagnosis of asthma and she has responded well to the combination of high dose ICS and intranasal steroids. Her FENO today was normal at 11 ppb and she feels significantly better, both of which are highly reassuring. She has been on the high dose ICS for 6 weeks and I will recommend continuing it for two more weeks, and then stepping down ICS therapy to Advair 1 puff twice daily. She will continue with Flonase and Montelukast as is.  - Fractional excretion of Nitric oxide (resulted at 11 ppb) - Continue Advair 250/50 two puffs twice daily for two more weeks - Then switch to taking Advair 250/50 one puff twice daily - Continue flonase once daily - Continue Montelukast once daily - Encouraged continued attempts at weight loss - GERD diet encouraged  #Pulmonary nodules   Patient tells me that she had an imaging study a year ago at an outside facility and was told she has pulmonary nodules requiring follow up in a year. She has not had repeat imaging since then. I have ordered a CT chest but this was denied by her insurance company twice. The patient will attempt to appeal.  -CT Chest with out contrast.  Return in about 6 months (around 03/27/2022). Return sooner if worsening symptoms.  I spent 30 minutes caring for this patient today, including preparing to see the patient, obtaining and/or reviewing separately obtained history, performing a medically appropriate  examination and/or evaluation, counseling and educating the patient/family/caregiver, ordering medications, tests, or procedures, documenting clinical information in the electronic health record, and independently interpreting results (not separately reported/billed) and communicating results to the patient/family/caregiver  Raechel Chute, MD [facility] 09/26/2021 4:50 PM    End of visit medications:  No orders of the defined types were placed in this encounter.    Current Outpatient Medications:    ADVAIR DISKUS 250-50 MCG/ACT AEPB, Inhale 1 puff into the lungs daily., Disp: 60 each, Rfl: 2   albuterol (VENTOLIN HFA) 108 (90 Base) MCG/ACT inhaler, Inhale 2 puffs into the lungs every 4 (four) hours as needed for wheezing or shortness of breath., Disp: 18 g, Rfl: 12   buPROPion (WELLBUTRIN XL) 150 MG 24 hr tablet, Take 1 tablet (150 mg total) by mouth daily., Disp: 90 tablet, Rfl: 1   cetirizine (ZYRTEC) 10 MG tablet, Take 10 mg by mouth daily., Disp: , Rfl:    citalopram (CELEXA) 20 MG tablet, , Disp: , Rfl:    fluticasone (FLONASE) 50 MCG/ACT nasal spray, Place 2 sprays into both nostrils daily., Disp: 16 g, Rfl: 0   HYDROcodone bit-homatropine (HYCODAN) 5-1.5 MG/5ML syrup, Take 5 mLs by mouth every 8 (eight) hours as needed for cough., Disp: 120 mL, Rfl: 0   montelukast (SINGULAIR) 10 MG tablet, Take 1 tablet (10 mg total) by mouth at bedtime., Disp: 90 tablet, Rfl: 3   Semaglutide-Weight Management 1 MG/0.5ML SOAJ, Inject 1 mg into the skin once a week., Disp: 2 mL, Rfl: 5   sertraline (  ZOLOFT) 100 MG tablet, Take 1 tablet (100 mg total) by mouth daily., Disp: 90 tablet, Rfl: 1   SUMAtriptan (IMITREX) 100 MG tablet, Take 1 tablet (100 mg total) by mouth daily. May repeat in 2 hours if headache persists or recurs., Disp: 9 tablet, Rfl: 5   topiramate (TOPAMAX) 200 MG tablet, Take 1 tablet (200 mg total) by mouth daily., Disp: 90 tablet, Rfl: 3   Subjective:    PATIENT ID: Carolyn Walton GENDER: female DOB: 04-24-1960, MRN: [id number]  Chief Complaint  Patient presents with   Follow-up    PFT. SOB with exertion and prod cough with green sputum that developed over the weekend.     HPI  Ms. Wimmer is a pleasant 61 year old female with a history of asthma (reports having the diagnosis placed in her 22s) who presents to clinic for follow up. I saw her first on 08/17/2021 in consultation for the chief complaint of shortness of breath and cough.   During the initial consultation, Ms. Corker reported an asthma exacerbation the week of July 4th from which she had never fully recovered with increase in cough and mucus production. She had been using the advair 250/50 once a day and reported symptoms of GERD. During said visit, I stepped up her therapy to 250/50 two puffs twice daily and added Flonase to help control allergic rhinitis. Blood work included an allergen screen and a PFT was obtained (showing significant response to bronchodilators).  Today, she reports feeling much better (8-9/10 better). She continues to have an occasional cough. 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 [place] [employer] had a mold infestation that was remediated over the summer. She was told her school had the toxic mold.  She has 2 dogs, 1 of which sheds heavily. She allows the dogs in her bedroom but not in her bed.  She does not have any carpet in her house and does not endorse having any mold.  She tells me that she has significant sensitivity to mold and will cough whenever exposed to it.  She works as a Psychologist, forensic.  She does not smoke or vape and never has.  She does report a history of reflux.  Ancillary information including prior medications, full medical/surgical/family/social histories, and PFTs (when available) are listed below and have been reviewed.   Review of Systems  Constitutional:  Negative for chills and fever.  HENT:  Negative for sinus pain.    Respiratory:  Positive for cough and sputum production. Negative for hemoptysis, shortness of breath and wheezing.   Cardiovascular:  Negative for chest pain.  Gastrointestinal:  Negative for abdominal pain.     Objective:   Vitals:   09/26/21 1632  BP: 122/70  Pulse: 80  Temp: 98.1 F (36.7 C)  TempSrc: Temporal  SpO2: 97%  Weight: 261 lb 12.8 oz (118.8 kg)  Height: 5' (1.524 m)   97% on RA BMI Readings from Last 3 Encounters:  09/26/21 51.13 kg/m  09/21/21 51.17 kg/m  08/17/21 51.21 kg/m   Wt Readings from Last 3 Encounters:  09/26/21 261 lb 12.8 oz (118.8 kg)  09/21/21 262 lb (118.8 kg)  08/17/21 262 lb 3.2 oz (118.9 kg)    Physical Exam Constitutional:      Appearance: Normal appearance. She is obese.  HENT:     Head: Normocephalic and atraumatic.     Nose: Congestion (inflammed turbinates) present.  Cardiovascular:     Rate and Rhythm: Normal rate and regular rhythm.  Pulses: Normal pulses.     Heart sounds: Normal heart sounds.  Pulmonary:     Effort: Pulmonary effort is normal.     Breath sounds: Normal breath sounds. No wheezing.  Abdominal:     General: There is distension.     Palpations: Abdomen is soft.  Neurological:     General: No focal deficit present.     Mental Status: She is alert and oriented to person, place, and time. Mental status is at baseline.       Ancillary Information    Past Medical History:  Diagnosis Date   Allergy    Arthritis    Asthma    Frequent headaches      No family history on file.   Past Surgical History:  Procedure Laterality Date   CESAREAN SECTION     times 2   KNEE ARTHROSCOPY WITH MEDIAL MENISECTOMY Left 06/17/2014   Procedure: KNEE ARTHROSCOPY WITH MEDIAL MENISECTOMY;  Surgeon: Juanell Fairly, MD;  Location: [facility] ORS;  Service: Orthopedics;  Laterality: Left;  partial medial menisectomy    Social History   Socioeconomic History   Marital status: Divorced    Spouse name: Not on file    Number of children: Not on file   Years of education: Not on file   Highest education level: Not on file  Occupational History   Not on file  Tobacco Use   Smoking status: Never   Smokeless tobacco: Never  Vaping Use   Vaping Use: Never used  Substance and Sexual Activity   Alcohol use: Yes    Comment: wine occasionally   Drug use: No   Sexual activity: Yes    Birth control/protection: Post-menopausal, None  Other Topics Concern   Not on file  Social History Narrative   Not on file   Social Determinants of Health   Financial Resource Strain: Not on file  Food Insecurity: Not on file  Transportation Needs: Not on file  Physical Activity: Not on file  Stress: Not on file  Social Connections: Not on file  Intimate Partner Violence: Not on file     Allergies  Allergen Reactions   Molds & Smuts Other (See Comments)    Sneezing   Other Other (See Comments)    CATS AND DOGS     CBC    Component Value Date/Time   WBC 7.6 08/17/2021 1145   RBC 4.93 08/17/2021 1145   HGB 13.9 08/17/2021 1145   HGB 14.2 04/13/2021 1051   HCT 43.7 08/17/2021 1145   HCT 43.8 04/13/2021 1051   PLT 170 08/17/2021 1145   PLT 167 04/13/2021 1051   MCV 88.6 08/17/2021 1145   MCV 87 04/13/2021 1051   MCV 88 10/10/2013 1131   MCH 28.2 08/17/2021 1145   MCHC 31.8 08/17/2021 1145   RDW 13.5 08/17/2021 1145   RDW 12.8 04/13/2021 1051   RDW 13.5 10/10/2013 1131   LYMPHSABS 2.1 08/17/2021 1145   LYMPHSABS 1.9 04/13/2021 1051   LYMPHSABS 1.7 10/10/2013 1131   MONOABS 0.5 08/17/2021 1145   MONOABS 0.4 10/10/2013 1131   EOSABS 0.4 08/17/2021 1145   EOSABS 0.2 04/13/2021 1051   EOSABS 0.3 10/10/2013 1131   BASOSABS 0.0 08/17/2021 1145   BASOSABS 0.0 04/13/2021 1051   BASOSABS 0.0 10/10/2013 1131    Pulmonary Functions Testing Results:    Latest Ref Rng & Units 09/23/2021    3:53 PM  PFT Results  FVC-Pre L 2.43   FVC-Predicted Pre % 85  FVC-Post L 2.65   FVC-Predicted Post % 93    Pre FEV1/FVC % % 81   Post FEV1/FCV % % 86   FEV1-Pre L 1.97   FEV1-Predicted Pre % 90   FEV1-Post L 2.28   DLCO uncorrected ml/min/mmHg 22.05   DLCO UNC% % 125   DLVA Predicted % 109   TLC L 4.56   TLC % Predicted % 102   RV % Predicted % 81     Outpatient Medications Prior to Visit  Medication Sig Dispense Refill   ADVAIR DISKUS 250-50 MCG/ACT AEPB Inhale 1 puff into the lungs daily. 60 each 2   albuterol (VENTOLIN HFA) 108 (90 Base) MCG/ACT inhaler Inhale 2 puffs into the lungs every 4 (four) hours as needed for wheezing or shortness of breath. 18 g 12   buPROPion (WELLBUTRIN XL) 150 MG 24 hr tablet Take 1 tablet (150 mg total) by mouth daily. 90 tablet 1   cetirizine (ZYRTEC) 10 MG tablet Take 10 mg by mouth daily.     citalopram (CELEXA) 20 MG tablet      fluticasone (FLONASE) 50 MCG/ACT nasal spray Place 2 sprays into both nostrils daily. 16 g 0   HYDROcodone bit-homatropine (HYCODAN) 5-1.5 MG/5ML syrup Take 5 mLs by mouth every 8 (eight) hours as needed for cough. 120 mL 0   montelukast (SINGULAIR) 10 MG tablet Take 1 tablet (10 mg total) by mouth at bedtime. 90 tablet 3   Semaglutide-Weight Management 1 MG/0.5ML SOAJ Inject 1 mg into the skin once a week. 2 mL 5   sertraline (ZOLOFT) 100 MG tablet Take 1 tablet (100 mg total) by mouth daily. 90 tablet 1   SUMAtriptan (IMITREX) 100 MG tablet Take 1 tablet (100 mg total) by mouth daily. May repeat in 2 hours if headache persists or recurs. 9 tablet 5   topiramate (TOPAMAX) 200 MG tablet Take 1 tablet (200 mg total) by mouth daily. 90 tablet 3   No facility-administered medications prior to visit.

## 2021-09-26 NOTE — Patient Instructions (Signed)
Today, your FENO score was 11 suggesting well controlled asthma. Continue using Advair 2 puffs twice a day for two more weeks then decrease to 1 puff twice a day.

## 2021-11-22 ENCOUNTER — Encounter: Payer: Self-pay | Admitting: Physician Assistant

## 2021-11-22 ENCOUNTER — Ambulatory Visit: Payer: BC Managed Care – PPO | Admitting: Physician Assistant

## 2021-11-22 DIAGNOSIS — Z124 Encounter for screening for malignant neoplasm of cervix: Secondary | ICD-10-CM

## 2021-11-22 DIAGNOSIS — R1011 Right upper quadrant pain: Secondary | ICD-10-CM | POA: Diagnosis not present

## 2021-11-22 DIAGNOSIS — Z1211 Encounter for screening for malignant neoplasm of colon: Secondary | ICD-10-CM

## 2021-11-22 DIAGNOSIS — Z1231 Encounter for screening mammogram for malignant neoplasm of breast: Secondary | ICD-10-CM

## 2021-11-22 DIAGNOSIS — Z23 Encounter for immunization: Secondary | ICD-10-CM | POA: Diagnosis not present

## 2021-11-22 MED ORDER — WEGOVY 1.7 MG/0.75ML ~~LOC~~ SOAJ: 1.7000 mg | SUBCUTANEOUS | 0 refills | Status: DC

## 2021-11-22 NOTE — Assessment & Plan Note (Addendum)
Pt was started on Wegovy for weight management 04/13/2021. Currently on 1 mg.  Initial weight was 270 lbs, BMI 52.83  Today 259 lbs, BMI 50.58  Advised increasing dose to 1.7 mg. Pt is agreeable.

## 2021-11-22 NOTE — Progress Notes (Signed)
     I,Sha'taria Tyson,acting as a Neurosurgeon for [employer], PA-C.,have documented all relevant documentation on the behalf of [name], PA-C,as directed by  [name], PA-C while in the presence of [name], PA-C.   Established patient visit   Patient: Carolyn Walton   DOB: [birth year]   61 y.o. Female  MRN: [id number] Visit Date: 11/22/2021  Today's healthcare provider: [name], PA-C   No chief complaint on file.  Subjective    HPI  -colonoscopy 11 years ago at [facility] Follow up for weight management  The patient was last seen for this 2 months ago. Changes made at last visit include Wegovy to 1 mg dose .  She reports excellent compliance with treatment. She feels that condition is Unchanged. She is not having side effects.   -----------------------------------------------------------------------------------------  years Diarrhea/ painful BM even will get up in the middle of the night   Medications: Outpatient Medications Prior to Visit  Medication Sig  . ADVAIR DISKUS 250-50 MCG/ACT AEPB Inhale 1 puff into the lungs daily.  Marland Kitchen albuterol (VENTOLIN HFA) 108 (90 Base) MCG/ACT inhaler Inhale 2 puffs into the lungs every 4 (four) hours as needed for wheezing or shortness of breath.  Marland Kitchen buPROPion (WELLBUTRIN XL) 150 MG 24 hr tablet Take 1 tablet (150 mg total) by mouth daily.  . cetirizine (ZYRTEC) 10 MG tablet Take 10 mg by mouth daily.  . citalopram (CELEXA) 20 MG tablet   . fluticasone (FLONASE) 50 MCG/ACT nasal spray Place 2 sprays into both nostrils daily.  Marland Kitchen HYDROcodone bit-homatropine (HYCODAN) 5-1.5 MG/5ML syrup Take 5 mLs by mouth every 8 (eight) hours as needed for cough.  . montelukast (SINGULAIR) 10 MG tablet Take 1 tablet (10 mg total) by mouth at bedtime.  . Semaglutide-Weight Management 1 MG/0.5ML SOAJ Inject 1 mg into the skin once a week.  . sertraline (ZOLOFT) 100 MG tablet Take 1 tablet (100 mg total) by mouth daily.  . SUMAtriptan  (IMITREX) 100 MG tablet Take 1 tablet (100 mg total) by mouth daily. May repeat in 2 hours if headache persists or recurs.  . topiramate (TOPAMAX) 200 MG tablet Take 1 tablet (200 mg total) by mouth daily.   No facility-administered medications prior to visit.    Review of Systems  {Labs  Heme  Chem  Endocrine  Serology  Results Review (optional):23779}   Objective    There were no vitals taken for this visit. {Show previous vital signs (optional):23777}  Physical Exam  ***  No results found for any visits on 11/22/21.  Assessment & Plan     ***  No follow-ups on file.      {provider attestation***:1}   [name], PA-C  [facility] [phone] (phone) [phone] (fax)  [facility]

## 2021-11-22 NOTE — Patient Instructions (Signed)
Please contact [phone] to schedule your mammogram. They will ask which location you prefer to be seen in. You have two options listed below.  1) [facility] located at [address] [place], [place] [address] 2) [facility] [place] located at [address] [place],  [address]  Please feel free to contact us if you have any further questions or concerns  

## 2021-11-23 ENCOUNTER — Encounter: Payer: Self-pay | Admitting: Physician Assistant

## 2021-11-23 DIAGNOSIS — R1011 Right upper quadrant pain: Secondary | ICD-10-CM | POA: Insufficient documentation

## 2021-11-23 LAB — COMPREHENSIVE METABOLIC PANEL
ALT: 12 IU/L (ref 0–32)
AST: 16 IU/L (ref 0–40)
Albumin/Globulin Ratio: 2 (ref 1.2–2.2)
Albumin: 4.5 g/dL (ref 3.9–4.9)
Alkaline Phosphatase: 114 IU/L (ref 44–121)
BUN/Creatinine Ratio: 18 (ref 12–28)
BUN: 14 mg/dL (ref 8–27)
Bilirubin Total: 0.3 mg/dL (ref 0.0–1.2)
CO2: 20 mmol/L (ref 20–29)
Calcium: 8.7 mg/dL (ref 8.7–10.3)
Chloride: 108 mmol/L — ABNORMAL HIGH (ref 96–106)
Creatinine, Ser: 0.78 mg/dL (ref 0.57–1.00)
Globulin, Total: 2.2 g/dL (ref 1.5–4.5)
Glucose: 96 mg/dL (ref 70–99)
Potassium: 3.8 mmol/L (ref 3.5–5.2)
Sodium: 141 mmol/L (ref 134–144)
Total Protein: 6.7 g/dL (ref 6.0–8.5)
eGFR: 86 mL/min/{1.73_m2} (ref >59)

## 2021-11-23 LAB — CBC WITH DIFFERENTIAL/PLATELET
Basophils Absolute: 0 10*3/uL (ref 0.0–0.2)
Basos: 0 %
EOS (ABSOLUTE): 0.2 10*3/uL (ref 0.0–0.4)
Eos: 3 %
Hematocrit: 43.2 % (ref 34.0–46.6)
Hemoglobin: 14.3 g/dL (ref 11.1–15.9)
Immature Grans (Abs): 0 10*3/uL (ref 0.0–0.1)
Immature Granulocytes: 0 %
Lymphocytes Absolute: 2 10*3/uL (ref 0.7–3.1)
Lymphs: 28 %
MCH: 28.7 pg (ref 26.6–33.0)
MCHC: 33.1 g/dL (ref 31.5–35.7)
MCV: 87 fL (ref 79–97)
Monocytes Absolute: 0.5 10*3/uL (ref 0.1–0.9)
Monocytes: 7 %
Neutrophils Absolute: 4.3 10*3/uL (ref 1.4–7.0)
Neutrophils: 62 %
Platelets: 192 10*3/uL (ref 150–450)
RBC: 4.98 x10E6/uL (ref 3.77–5.28)
RDW: 12.5 % (ref 11.7–15.4)
WBC: 7 10*3/uL (ref 3.4–10.8)

## 2021-11-23 LAB — HEMOGLOBIN A1C
Est. average glucose Bld gHb Est-mCnc: 97 mg/dL
Hgb A1c MFr Bld: 5 % (ref 4.8–5.6)

## 2021-11-23 LAB — AMYLASE: Amylase: 26 U/L — ABNORMAL LOW (ref 31–110)

## 2021-11-23 LAB — LIPASE: Lipase: 38 U/L (ref 14–72)

## 2021-11-23 NOTE — Assessment & Plan Note (Signed)
Exam nonimpressive. She is due for colonoscopy-- referred to GI Ordered bw, cbc, cmp, lipase, amylase. Ordered RUQ ultrasound.  Advised symptoms could be consistent w/ IBS-D but need to r/o other conditions

## 2021-11-30 ENCOUNTER — Telehealth: Payer: Self-pay

## 2021-11-30 ENCOUNTER — Other Ambulatory Visit: Payer: Self-pay | Admitting: Physician Assistant

## 2021-11-30 ENCOUNTER — Other Ambulatory Visit: Payer: Self-pay

## 2021-11-30 ENCOUNTER — Ambulatory Visit
Admission: RE | Admit: 2021-11-30 | Discharge: 2021-11-30 | Disposition: A | Discharge status: Home / Self Care | Payer: BC Managed Care – PPO | Source: Ambulatory Visit | Attending: Physician Assistant | Admitting: Physician Assistant

## 2021-11-30 DIAGNOSIS — Z1211 Encounter for screening for malignant neoplasm of colon: Secondary | ICD-10-CM

## 2021-11-30 DIAGNOSIS — R1011 Right upper quadrant pain: Secondary | ICD-10-CM | POA: Insufficient documentation

## 2021-11-30 DIAGNOSIS — K824 Cholesterolosis of gallbladder: Secondary | ICD-10-CM

## 2021-11-30 MED ORDER — NA SULFATE-K SULFATE-MG SULF 17.5-3.13-1.6 GM/177ML PO SOLN: 1.0000 | Freq: Once | ORAL | 0 refills | Status: AC

## 2021-11-30 NOTE — Telephone Encounter (Signed)
Reviewed her chart and PCPs note.  She is undergoing workup for chronic symptoms of right upper quadrant pain and diarrhea.  Reviewed her right upper quadrant ultrasound results.  Looks like she will need a referral to general surgery about gallbladder polyp.  If patient is agreeable, please schedule upper endoscopy along with colonoscopy and I can see her in the office after the procedures  Lannette Donath, MD

## 2021-11-30 NOTE — Telephone Encounter (Signed)
Gastroenterology Pre-Procedure Review  Request Date: 12/14/22 Requesting Physician: Dr. Allegra Lai  PATIENT REVIEW QUESTIONS: The patient responded to the following health history questions as indicated:    1. Are you having any GI issues? yes (after eating her meals, food goes straight through, oily stools, more than 4 bm wakes her up in the middle of the night, has been going on for a while, sharp stomach pain associated with loose stool) 2. Do you have a personal history of Polyps? no 3. Do you have a family history of Colon Cancer or Polyps? no 4. Diabetes Mellitus? no takes [place].  Has been asked to stop 7 days before procedure. 5. Joint replacements in the past 12 months?no 6. Major health problems in the past 3 months?no 7. Any artificial heart valves, MVP, or defibrillator? Heart attack a year ago  Cardiologist was Gwen Pounds but recently retired    MEDICATIONS & ALLERGIES:    Patient reports the following regarding taking any anticoagulation/antiplatelet therapy:   Plavix, Coumadin, Eliquis, Xarelto, Lovenox, Pradaxa, Brilinta, or Effient? no Aspirin? no  Patient confirms/reports the following medications:  Current Outpatient Medications  Medication Sig Dispense Refill   ADVAIR DISKUS 250-50 MCG/ACT AEPB Inhale 1 puff into the lungs daily. 60 each 2   albuterol (VENTOLIN HFA) 108 (90 Base) MCG/ACT inhaler Inhale 2 puffs into the lungs every 4 (four) hours as needed for wheezing or shortness of breath. 18 g 12   buPROPion (WELLBUTRIN XL) 150 MG 24 hr tablet Take 1 tablet (150 mg total) by mouth daily. 90 tablet 1   cetirizine (ZYRTEC) 10 MG tablet Take 10 mg by mouth daily.     citalopram (CELEXA) 20 MG tablet      fluticasone (FLONASE) 50 MCG/ACT nasal spray Place 2 sprays into both nostrils daily. 16 g 0   HYDROcodone bit-homatropine (HYCODAN) 5-1.5 MG/5ML syrup Take 5 mLs by mouth every 8 (eight) hours as needed for cough. 120 mL 0   montelukast (SINGULAIR) 10 MG tablet Take 1  tablet (10 mg total) by mouth at bedtime. 90 tablet 3   Semaglutide-Weight Management (WEGOVY) 1.7 MG/0.75ML SOAJ Inject 1.7 mg into the skin once a week. 6 mL 0   sertraline (ZOLOFT) 100 MG tablet Take 1 tablet (100 mg total) by mouth daily. 90 tablet 1   SUMAtriptan (IMITREX) 100 MG tablet Take 1 tablet (100 mg total) by mouth daily. May repeat in 2 hours if headache persists or recurs. 9 tablet 5   topiramate (TOPAMAX) 200 MG tablet Take 1 tablet (200 mg total) by mouth daily. 90 tablet 3   No current facility-administered medications for this visit.    Patient confirms/reports the following allergies:  Allergies  Allergen Reactions   Molds & Smuts Other (See Comments)    Sneezing   Other Other (See Comments)    CATS AND DOGS    No orders of the defined types were placed in this encounter.   AUTHORIZATION INFORMATION Primary Insurance: 1D#: Group #:  Secondary Insurance: 1D#: Group #:  SCHEDULE INFORMATION: Date: 12/14/22 Time: Location: [facility]

## 2021-11-30 NOTE — Telephone Encounter (Signed)
LVM for pt to return my call to ask if she is willing to have an EGD added to her 12/14/22 colonoscopy.  Per Dr. Allegra Lai for RUQ Pain and diarrhea.  Advise patient that Dr. Allegra Lai will follow up in office after procedures.  Thanks,  [place], [place]

## 2021-12-02 ENCOUNTER — Ambulatory Visit: Payer: BC Managed Care – PPO | Admitting: Surgery

## 2021-12-02 ENCOUNTER — Encounter: Payer: Self-pay | Admitting: Surgery

## 2021-12-02 VITALS — BP 149/81 | HR 65 | Temp 98.0°F | Ht 60.0 in | Wt 255.0 lb

## 2021-12-02 DIAGNOSIS — K824 Cholesterolosis of gallbladder: Secondary | ICD-10-CM | POA: Diagnosis not present

## 2021-12-02 NOTE — H&P (View-Only) (Signed)
12/02/2021  Reason for Visit:  Gallbladder polyps  Requesting Provider:  [name], PA-C  History of Present Illness: Carolyn Walton is a 61 y.o. female presenting for evaluation of gallbladder polyps.  The patient reports several year history of RUQ abdominal pain.  This is associated with nausea episodes and also with diarrhea.  She reports that recently the pain and symptoms have been worsening.  Initially she thought that maybe it could be diet related and she made changes by removing gluten contents.  This helped for a bit but the symptoms continued.  She reports that greasy foods particularly aggravate things.  She reports that after eating, she will have pain in the RUQ, sometimes radiates to the back, and she will be able to feel the food moving quickly through the intestines and about half hour later she has diarrhea.  The pain sometimes wakes her up from sleep and sometimes she will have bowel movement overnight.  Denies any fevers, chills, chest pain, shortness of breath, other areas of abdominal pain.  Past Medical History: Past Medical History:  Diagnosis Date   Allergy    Arthritis    Asthma    Frequent headaches      Past Surgical History: Past Surgical History:  Procedure Laterality Date   CESAREAN SECTION     times 2   KNEE ARTHROSCOPY WITH MEDIAL MENISECTOMY Left 06/17/2014   Procedure: KNEE ARTHROSCOPY WITH MEDIAL MENISECTOMY;  Surgeon: Juanell Fairly, MD;  Location: [facility] ORS;  Service: Orthopedics;  Laterality: Left;  partial medial menisectomy    Home Medications: Prior to Admission medications   Medication Sig Start Date End Date Taking? Authorizing Provider  ADVAIR DISKUS 250-50 MCG/ACT AEPB Inhale 1 puff into the lungs daily. 12/24/20  Yes Maple Hudson., MD  albuterol (VENTOLIN HFA) 108 (90 Base) MCG/ACT inhaler Inhale 2 puffs into the lungs every 4 (four) hours as needed for wheezing or shortness of breath. 01/31/17  Yes Maple Hudson., MD   buPROPion (WELLBUTRIN XL) 150 MG 24 hr tablet Take 1 tablet (150 mg total) by mouth daily. 09/12/21  Yes Maple Hudson., MD  cetirizine (ZYRTEC) 10 MG tablet Take 10 mg by mouth daily.   Yes [provider]  citalopram (CELEXA) 20 MG tablet  02/03/13  Yes [provider]  fluticasone (FLONASE) 50 MCG/ACT nasal spray Place 2 sprays into both nostrils daily. 09/15/21 03/14/22 Yes Dgayli, Lianne Bushy, MD  montelukast (SINGULAIR) 10 MG tablet Take 1 tablet (10 mg total) by mouth at bedtime. 05/20/21  Yes Maple Hudson., MD  Semaglutide-Weight Management [facility]) 1.7 MG/0.75ML SOAJ Inject 1.7 mg into the skin once a week. 11/22/21  Yes [name], PA-C  sertraline (ZOLOFT) 100 MG tablet Take 1 tablet (100 mg total) by mouth daily. 03/20/19  Yes Maple Hudson., MD  SUMAtriptan (IMITREX) 100 MG tablet Take 1 tablet (100 mg total) by mouth daily. May repeat in 2 hours if headache persists or recurs. 08/13/21  Yes Maple Hudson., MD  topiramate (TOPAMAX) 200 MG tablet Take 1 tablet (200 mg total) by mouth daily. 06/20/21  Yes Maple Hudson., MD    Allergies: Allergies  Allergen Reactions   Molds & Smuts Other (See Comments)    Sneezing   Other Other (See Comments)    CATS AND DOGS    Social History:  reports that she has never smoked. She has never been exposed to tobacco smoke. She has never used  smokeless tobacco. She reports current alcohol use. She reports that she does not use drugs.   Family History: No family history on file.  Review of Systems: Review of Systems  Constitutional:  Negative for chills and fever.  HENT:  Negative for hearing loss.   Respiratory:  Negative for shortness of breath.   Cardiovascular:  Negative for chest pain.  Gastrointestinal:  Positive for abdominal pain, diarrhea and nausea. Negative for constipation and vomiting.  Genitourinary:  Negative for dysuria.  Musculoskeletal:  Negative for myalgias.  Skin:   Negative for rash.  Neurological:  Negative for dizziness.  Psychiatric/Behavioral:  Negative for depression.     Physical Exam BP (!) 149/81   Pulse 65   Temp 98 F (36.7 C)   Ht 5' (1.524 m)   Wt 255 lb (115.7 kg)   SpO2 99%   BMI 49.80 kg/m  CONSTITUTIONAL: No acute distress HEENT:  Normocephalic, atraumatic, extraocular motion intact. NECK: Trachea is midline, and there is no jugular venous distension.  RESPIRATORY:  Lungs are clear, and breath sounds are equal bilaterally. Normal respiratory effort without pathologic use of accessory muscles. CARDIOVASCULAR: Heart is regular without murmurs, gallops, or rubs. GI: The abdomen is soft, obese, non-distended, with some discomfort to palpation in the RUQ and epigastric area.  Negative Murphy's sign.  MUSCULOSKELETAL:  Normal muscle strength and tone in all four extremities.  No peripheral edema or cyanosis. SKIN: Skin turgor is normal. There are no pathologic skin lesions.  NEUROLOGIC:  Motor and sensation is grossly normal.  Cranial nerves are grossly intact. PSYCH:  Alert and oriented to person, place and time. Affect is normal.  Laboratory Analysis: Labs from 11/22/21: Sodium 141, potassium 3.8, chloride 108, CO2 20, BUN 14, creatinine 0.78.  Total bilirubin 0.3, AST 16, ALT 12, alkaline phosphatase 114, lipase 38, amylase 26, albumin 4.5.  WBC 7, hemoglobin 14.3, hematocrit 43.2, platelets 192.  Imaging: Ultrasound RUQ on 11/30/2021: IMPRESSION: 1. No cholelithiasis or sonographic evidence of acute cholecystitis. 2. 10 mm gallbladder polyp versus tumefactive sludge. Surgical consultation is recommended. If cholecystectomy is not performed, follow up Ultrasound is recommended in 6 months.  Assessment and Plan: This is a 61 y.o. female with gallbladder polyps.  - Discussed with the patient the findings on her laboratory studies and ultrasound images.  Overall the patient has 2 polyps and based on location, potentially the  smaller one is closer to the neck of the gallbladder which could possibly be acting as an obstructive point.  Discussed with the patient that based on her symptoms, she also could be having a component of biliary dyskinesia given her diarrhea and pain.  Discussed with her that unfortunately there is no good medical way to manage the issues with her gallbladder given that one of the polyps is 1 cm in size, will also be recommending cholecystectomy.  The patient is in agreement. - Discussed with her the plan for robotic assisted cholecystectomy with ICG cholangiogram.  Reviewed the surgery at length with her including the incisions, the risks of bleeding, infection, injury to surrounding structures, this would be an outpatient procedure, the use of ICG to better evaluate the biliary anatomy, postoperative activity restrictions, pain control, and she is willing to proceed. - We will schedule her for surgery on 12/15/2021.  All of her questions have been answered.  I spent 55 minutes dedicated to the care of this patient on the date of this encounter to include pre-visit review of records, face-to-face time with the  patient discussing diagnosis and management, and any post-visit coordination of care.   Melvyn Neth, [facility]

## 2021-12-02 NOTE — Progress Notes (Signed)
12/02/2021  Reason for Visit:  Gallbladder polyps  Requesting Provider:  [name], PA-C  History of Present Illness: Carolyn Walton is a 61 y.o. female presenting for evaluation of gallbladder polyps.  The patient reports several year history of RUQ abdominal pain.  This is associated with nausea episodes and also with diarrhea.  She reports that recently the pain and symptoms have been worsening.  Initially she thought that maybe it could be diet related and she made changes by removing gluten contents.  This helped for a bit but the symptoms continued.  She reports that greasy foods particularly aggravate things.  She reports that after eating, she will have pain in the RUQ, sometimes radiates to the back, and she will be able to feel the food moving quickly through the intestines and about half hour later she has diarrhea.  The pain sometimes wakes her up from sleep and sometimes she will have bowel movement overnight.  Denies any fevers, chills, chest pain, shortness of breath, other areas of abdominal pain.  Past Medical History: Past Medical History:  Diagnosis Date   Allergy    Arthritis    Asthma    Frequent headaches      Past Surgical History: Past Surgical History:  Procedure Laterality Date   CESAREAN SECTION     times 2   KNEE ARTHROSCOPY WITH MEDIAL MENISECTOMY Left 06/17/2014   Procedure: KNEE ARTHROSCOPY WITH MEDIAL MENISECTOMY;  Surgeon: Juanell Fairly, MD;  Location: [facility] ORS;  Service: Orthopedics;  Laterality: Left;  partial medial menisectomy    Home Medications: Prior to Admission medications   Medication Sig Start Date End Date Taking? Authorizing Provider  ADVAIR DISKUS 250-50 MCG/ACT AEPB Inhale 1 puff into the lungs daily. 12/24/20  Yes Maple Hudson., MD  albuterol (VENTOLIN HFA) 108 (90 Base) MCG/ACT inhaler Inhale 2 puffs into the lungs every 4 (four) hours as needed for wheezing or shortness of breath. 01/31/17  Yes Maple Hudson., MD   buPROPion (WELLBUTRIN XL) 150 MG 24 hr tablet Take 1 tablet (150 mg total) by mouth daily. 09/12/21  Yes Maple Hudson., MD  cetirizine (ZYRTEC) 10 MG tablet Take 10 mg by mouth daily.   Yes [provider]  citalopram (CELEXA) 20 MG tablet  02/03/13  Yes [provider]  fluticasone (FLONASE) 50 MCG/ACT nasal spray Place 2 sprays into both nostrils daily. 09/15/21 03/14/22 Yes Dgayli, Lianne Bushy, MD  montelukast (SINGULAIR) 10 MG tablet Take 1 tablet (10 mg total) by mouth at bedtime. 05/20/21  Yes Maple Hudson., MD  Semaglutide-Weight Management [facility]) 1.7 MG/0.75ML SOAJ Inject 1.7 mg into the skin once a week. 11/22/21  Yes [name], PA-C  sertraline (ZOLOFT) 100 MG tablet Take 1 tablet (100 mg total) by mouth daily. 03/20/19  Yes Maple Hudson., MD  SUMAtriptan (IMITREX) 100 MG tablet Take 1 tablet (100 mg total) by mouth daily. May repeat in 2 hours if headache persists or recurs. 08/13/21  Yes Maple Hudson., MD  topiramate (TOPAMAX) 200 MG tablet Take 1 tablet (200 mg total) by mouth daily. 06/20/21  Yes Maple Hudson., MD    Allergies: Allergies  Allergen Reactions   Molds & Smuts Other (See Comments)    Sneezing   Other Other (See Comments)    CATS AND DOGS    Social History:  reports that she has never smoked. She has never been exposed to tobacco smoke. She has never used  smokeless tobacco. She reports current alcohol use. She reports that she does not use drugs.   Family History: No family history on file.  Review of Systems: Review of Systems  Constitutional:  Negative for chills and fever.  HENT:  Negative for hearing loss.   Respiratory:  Negative for shortness of breath.   Cardiovascular:  Negative for chest pain.  Gastrointestinal:  Positive for abdominal pain, diarrhea and nausea. Negative for constipation and vomiting.  Genitourinary:  Negative for dysuria.  Musculoskeletal:  Negative for myalgias.  Skin:   Negative for rash.  Neurological:  Negative for dizziness.  Psychiatric/Behavioral:  Negative for depression.     Physical Exam BP (!) 149/81   Pulse 65   Temp 98 F (36.7 C)   Ht 5' (1.524 m)   Wt 255 lb (115.7 kg)   SpO2 99%   BMI 49.80 kg/m  CONSTITUTIONAL: No acute distress HEENT:  Normocephalic, atraumatic, extraocular motion intact. NECK: Trachea is midline, and there is no jugular venous distension.  RESPIRATORY:  Lungs are clear, and breath sounds are equal bilaterally. Normal respiratory effort without pathologic use of accessory muscles. CARDIOVASCULAR: Heart is regular without murmurs, gallops, or rubs. GI: The abdomen is soft, obese, non-distended, with some discomfort to palpation in the RUQ and epigastric area.  Negative Murphy's sign.  MUSCULOSKELETAL:  Normal muscle strength and tone in all four extremities.  No peripheral edema or cyanosis. SKIN: Skin turgor is normal. There are no pathologic skin lesions.  NEUROLOGIC:  Motor and sensation is grossly normal.  Cranial nerves are grossly intact. PSYCH:  Alert and oriented to person, place and time. Affect is normal.  Laboratory Analysis: Labs from 11/22/21: Sodium 141, potassium 3.8, chloride 108, CO2 20, BUN 14, creatinine 0.78.  Total bilirubin 0.3, AST 16, ALT 12, alkaline phosphatase 114, lipase 38, amylase 26, albumin 4.5.  WBC 7, hemoglobin 14.3, hematocrit 43.2, platelets 192.  Imaging: Ultrasound RUQ on 11/30/2021: IMPRESSION: 1. No cholelithiasis or sonographic evidence of acute cholecystitis. 2. 10 mm gallbladder polyp versus tumefactive sludge. Surgical consultation is recommended. If cholecystectomy is not performed, follow up Ultrasound is recommended in 6 months.  Assessment and Plan: This is a 60 y.o. female with gallbladder polyps.  - Discussed with the patient the findings on her laboratory studies and ultrasound images.  Overall the patient has 2 polyps and based on location, potentially the  smaller one is closer to the neck of the gallbladder which could possibly be acting as an obstructive point.  Discussed with the patient that based on her symptoms, she also could be having a component of biliary dyskinesia given her diarrhea and pain.  Discussed with her that unfortunately there is no good medical way to manage the issues with her gallbladder given that one of the polyps is 1 cm in size, will also be recommending cholecystectomy.  The patient is in agreement. - Discussed with her the plan for robotic assisted cholecystectomy with ICG cholangiogram.  Reviewed the surgery at length with her including the incisions, the risks of bleeding, infection, injury to surrounding structures, this would be an outpatient procedure, the use of ICG to better evaluate the biliary anatomy, postoperative activity restrictions, pain control, and she is willing to proceed. - We will schedule her for surgery on 12/15/2021.  All of her questions have been answered.  I spent 55 minutes dedicated to the care of this patient on the date of this encounter to include pre-visit review of records, face-to-face time with the  patient discussing diagnosis and management, and any post-visit coordination of care.   Melvyn Neth, [facility]

## 2021-12-02 NOTE — Patient Instructions (Addendum)
You have requested to have your gallbladder removed. This will be done at [facility] with Dr. Aleen Campi. We are looking at doing this on December 14th.   Stop your Wegovy injection at least one week prior to surgery.   You will most likely be out of work 1-2 weeks for this surgery.  If you have FMLA or disability paperwork that needs filled out you may drop this off at our office or this can be faxed to [phone].  You will return after your post-op appointment with a lifting restriction for approximately 4 more weeks.  You will be able to eat anything you would like to following surgery. But, start by eating a bland diet and advance this as tolerated. The Gallbladder diet is below, please go as closely by this diet as possible prior to surgery to avoid any further attacks.  Please see the (blue)pre-care form that you have been given today. Our surgery scheduler will call you to verify surgery date and to go over information.   If you have any questions, please call our office.  Laparoscopic Cholecystectomy Laparoscopic cholecystectomy is surgery to remove the gallbladder. The gallbladder is located in the upper right part of the abdomen, behind the liver. It is a storage sac for bile, which is produced in the liver. Bile aids in the digestion and absorption of fats. Cholecystectomy is often done for inflammation of the gallbladder (cholecystitis). This condition is usually caused by a buildup of gallstones (cholelithiasis) in the gallbladder. Gallstones can block the flow of bile, and that can result in inflammation and pain. In severe cases, emergency surgery may be required. If emergency surgery is not required, you will have time to prepare for the procedure. Laparoscopic surgery is an alternative to open surgery. Laparoscopic surgery has a shorter recovery time. Your common bile duct may also need to be examined during the procedure. If stones are found in the common bile duct, they  may be removed. LET [facility] CARE PROVIDER KNOW ABOUT: Any allergies you have. All medicines you are taking, including vitamins, herbs, eye drops, creams, and over-the-counter medicines. Previous problems you or members of your family have had with the use of anesthetics. Any blood disorders you have. Previous surgeries you have had.  Any medical conditions you have. RISKS AND COMPLICATIONS Generally, this is a safe procedure. However, problems may occur, including: Infection. Bleeding. Allergic reactions to medicines. Damage to other structures or organs. A stone remaining in the common bile duct. A bile leak from the cyst duct that is clipped when your gallbladder is removed. The need to convert to open surgery, which requires a larger incision in the abdomen. This may be necessary if your surgeon thinks that it is not safe to continue with a laparoscopic procedure. BEFORE THE PROCEDURE Ask your health care provider about: Changing or stopping your regular medicines. This is especially important if you are taking diabetes medicines or blood thinners. Taking medicines such as aspirin and ibuprofen. These medicines can thin your blood. Do not take these medicines before your procedure if your health care provider instructs you not to. Follow instructions from your health care provider about eating or drinking restrictions. Let your health care provider know if you develop a cold or an infection before surgery. Plan to have someone take you home after the procedure. Ask your health care provider how your surgical site will be marked or identified. You may be given antibiotic medicine to help prevent infection. PROCEDURE To reduce  your risk of infection: Your health care team will wash or sanitize their hands. Your skin will be washed with soap. An IV tube may be inserted into one of your veins. You will be given a medicine to make you fall asleep (general anesthetic). A breathing  tube will be placed in your mouth. The surgeon will make several small cuts (incisions) in your abdomen. A thin, lighted tube (laparoscope) that has a tiny camera on the end will be inserted through one of the small incisions. The camera on the laparoscope will send a picture to a TV screen (monitor) in the operating room. This will give the surgeon a good view inside your abdomen. A gas will be pumped into your abdomen. This will expand your abdomen to give the surgeon more room to perform the surgery. Other tools that are needed for the procedure will be inserted through the other incisions. The gallbladder will be removed through one of the incisions. After your gallbladder has been removed, the incisions will be closed with stitches (sutures), staples, or skin glue. Your incisions may be covered with a bandage (dressing). The procedure may vary among health care providers and hospitals. AFTER THE PROCEDURE Your blood pressure, heart rate, breathing rate, and blood oxygen level will be monitored often until the medicines you were given have worn off. You will be given medicines as needed to control your pain.   This information is not intended to replace advice given to you by your health care provider. Make sure you discuss any questions you have with your health care provider.   Document Released: 12/19/2004 Document Revised: 09/09/2014 Document Reviewed: 07/31/2012 Elsevier Interactive Patient Education 2016 Elsevier Inc.   Low-Fat Diet for Gallbladder Conditions A low-fat diet can be helpful if you have pancreatitis or a gallbladder condition. With these conditions, your pancreas and gallbladder have trouble digesting fats. A healthy eating plan with less fat will help rest your pancreas and gallbladder and reduce your symptoms. WHAT DO I NEED TO KNOW ABOUT THIS DIET? Eat a low-fat diet. Reduce your fat intake to less than 20-30% of your total daily calories. This is less than 50-60 g  of fat per day. Remember that you need some fat in your diet. Ask your dietician what your daily goal should be. Choose nonfat and low-fat healthy foods. Look for the words "nonfat," "low fat," or "fat free." As a guide, look on the label and choose foods with less than 3 g of fat per serving. Eat only one serving. Avoid alcohol. Do not smoke. If you need help quitting, talk with your health care provider. Eat small frequent meals instead of three large heavy meals. WHAT FOODS CAN I EAT? Grains Include healthy grains and starches such as potatoes, wheat bread, fiber-rich cereal, and brown rice. Choose whole grain options whenever possible. In adults, whole grains should account for 45-65% of your daily calories.  Fruits and Vegetables Eat plenty of fruits and vegetables. Fresh fruits and vegetables add fiber to your diet. Meats and Other Protein Sources Eat lean meat such as chicken and pork. Trim any fat off of meat before cooking it. Eggs, fish, and beans are other sources of protein. In adults, these foods should account for 10-35% of your daily calories. Dairy Choose low-fat milk and dairy options. Dairy includes fat and protein, as well as calcium.  Fats and Oils Limit high-fat foods such as fried foods, sweets, baked goods, sugary drinks.  Other Creamy sauces and condiments, such as  mayonnaise, can add extra fat. Think about whether or not you need to use them, or use smaller amounts or low fat options. WHAT FOODS ARE NOT RECOMMENDED? High fat foods, such as: [employer]. Ice cream. [place] toast. Sweet rolls. Pizza. Cheese bread. Foods covered with batter, butter, creamy sauces, or cheese. Fried foods. Sugary drinks and desserts. Foods that cause gas or bloating   This information is not intended to replace advice given to you by your health care provider. Make sure you discuss any questions you have with your health care provider.   Document Released: 12/24/2012 Document  Reviewed: 12/24/2012 Elsevier Interactive Patient Education [employer]

## 2021-12-06 ENCOUNTER — Telehealth: Payer: Self-pay | Admitting: Surgery

## 2021-12-06 NOTE — Telephone Encounter (Signed)
Left message for patient to call, please inform her of the following regarding scheduled surgery.   Pre-Admission date/time, and Surgery date at [facility].  Surgery Date: 12/15/21 Preadmission Testing Date: 12/09/21 (phone 1p-5p)  Also patient will need to call at [phone], between 1-3:00pm the day before surgery, to find out what time to arrive for surgery.

## 2021-12-07 ENCOUNTER — Telehealth: Payer: Self-pay

## 2021-12-07 NOTE — Telephone Encounter (Signed)
Voice message has been left for patient to call office back in regard to rescheduling her colonoscopy from [facility] on 12/13/21 to [facility] instead of [facility] due to BMI.  Will see if she is available for 12/14/21 at [facility] instead.  Thanks,  [place], [place]

## 2021-12-07 NOTE — Telephone Encounter (Signed)
Patient calls back, she is now informed of all dates regarding her surgery.   

## 2021-12-07 NOTE — Telephone Encounter (Signed)
Patient returned phone call.  She was okay with having the EGD added to her colonoscopy for diarrhea and RUQ Pain.  Date change and location change to 12/14/21 with Dr. Allegra Lai due to BMI okayed by patient.  Thanks,  [place], [place]

## 2021-12-09 ENCOUNTER — Encounter
Admission: RE | Admit: 2021-12-09 | Discharge: 2021-12-09 | Disposition: A | Discharge status: Home / Self Care | Payer: BC Managed Care – PPO | Source: Ambulatory Visit | Attending: Surgery | Admitting: Surgery

## 2021-12-09 ENCOUNTER — Other Ambulatory Visit: Payer: Self-pay

## 2021-12-09 DIAGNOSIS — Z01812 Encounter for preprocedural laboratory examination: Secondary | ICD-10-CM

## 2021-12-09 HISTORY — DX: Anxiety disorder, unspecified: F41.9

## 2021-12-09 HISTORY — DX: Depression, unspecified: F32.A

## 2021-12-09 HISTORY — DX: Pneumonia, unspecified organism: J18.9

## 2021-12-09 NOTE — Patient Instructions (Addendum)
Your procedure is scheduled on:12/15/21 - Thursday Report to the Registration Desk on the 1st floor of the [facility]. To find out your arrival time, please call [phone] between 1PM - 3PM on: 12/14/21 - Wednesday If your arrival time is 6:00 am, do not arrive prior to that time as the [facility] entrance doors do not open until 6:00 am.  REMEMBER: Instructions that are not followed completely may result in serious medical risk, up to and including death; or upon the discretion of your surgeon and anesthesiologist your surgery may need to be rescheduled.  Do not eat food after midnight the night before surgery.  No gum chewing, lozengers or hard candies.  You may however, drink CLEAR liquids up to 2 hours before you are scheduled to arrive for your surgery. Do not drink anything within 2 hours of your scheduled arrival time.  Clear liquids include: - water  - apple juice without pulp - gatorade (not RED colors) - black coffee or tea (Do NOT add milk or creamers to the coffee or tea) Do NOT drink anything that is not on this list.  TAKE THESE MEDICATIONS THE MORNING OF SURGERY WITH A SIP OF WATER:  - ADVAIR DISKUS  - albuterol (VENTOLIN HFA)  - Fluticasone (FLONASE)   Stop your Wegovy injection one week prior to surgery.    One week prior to surgery: Stop Anti-inflammatories (NSAIDS) such as Advil, Aleve, Ibuprofen, Motrin, Naproxen, Naprosyn and Aspirin based products such as Excedrin, Goodys Powder, BC Powder.  Stop ANY OVER THE COUNTER supplements until after surgery.  You may take Tylenol if needed for pain up until the day of surgery.  No Alcohol for 24 hours before or after surgery.  No Smoking including e-cigarettes for 24 hours prior to surgery.  No chewable tobacco products for at least 6 hours prior to surgery.  No nicotine patches on the day of surgery.  Do not use any "recreational" drugs for at least a week prior to your surgery.  Please be advised that  the combination of cocaine and anesthesia may have negative outcomes, up to and including death. If you test positive for cocaine, your surgery will be cancelled.  On the morning of surgery brush your teeth with toothpaste and water, you may rinse your mouth with mouthwash if you wish. Do not swallow any toothpaste or mouthwash.  Use CHG Soap or wipes as directed on instruction sheet.  Do not wear jewelry, make-up, hairpins, clips or nail polish.  Do not wear lotions, powders, or perfumes.   Do not shave body from the neck down 48 hours prior to surgery just in case you cut yourself which could leave a site for infection. Also, freshly shaved skin may become irritated if using the CHG soap.  Contact lenses, hearing aids and dentures may not be worn into surgery.  Do not bring valuables to the hospital. [facility] is not responsible for any missing/lost belongings or valuables.   Notify your doctor if there is any change in your medical condition (cold, fever, infection).  Wear comfortable clothing (specific to your surgery type) to the hospital.  After surgery, you can help prevent lung complications by doing breathing exercises.  Take deep breaths and cough every 1-2 hours. Your doctor may order a device called an Incentive Spirometer to help you take deep breaths. When coughing or sneezing, hold a pillow firmly against your incision with both hands. This is called "splinting." Doing this helps protect your incision. It  also decreases belly discomfort.  If you are being admitted to the hospital overnight, leave your suitcase in the car. After surgery it may be brought to your room.  If you are being discharged the day of surgery, you will not be allowed to drive home. You will need a responsible adult (18 years or older) to drive you home and stay with you that night.   If you are taking public transportation, you will need to have a responsible adult (18 years or older) with  you. Please confirm with your physician that it is acceptable to use public transportation.   Please call the Pre-admissions Testing Dept. at [phone] if you have any questions about these instructions.  Surgery Visitation Policy:  Patients undergoing a surgery or procedure may have two family members or support persons with them as long as the person is not COVID-19 positive or experiencing its symptoms.   Inpatient Visitation:    Visiting hours are 7 a.m. to 8 p.m. Up to four visitors are allowed at one time in a patient room. The visitors may rotate out with other people during the day. One designated support person (adult) may remain overnight.  Due to an increase in RSV and influenza rates and associated hospitalizations, children ages 12 and under will not be able to visit patients in [facility]. Masks continue to be strongly recommended.

## 2021-12-13 ENCOUNTER — Encounter: Payer: Self-pay | Admitting: Urgent Care

## 2021-12-13 ENCOUNTER — Encounter
Admission: RE | Admit: 2021-12-13 | Discharge: 2021-12-13 | Disposition: A | Discharge status: Home / Self Care | Payer: BC Managed Care – PPO | Source: Ambulatory Visit | Attending: Surgery | Admitting: Surgery

## 2021-12-13 DIAGNOSIS — Z01810 Encounter for preprocedural cardiovascular examination: Secondary | ICD-10-CM | POA: Insufficient documentation

## 2021-12-13 DIAGNOSIS — Z1211 Encounter for screening for malignant neoplasm of colon: Secondary | ICD-10-CM | POA: Diagnosis present

## 2021-12-13 DIAGNOSIS — R9431 Abnormal electrocardiogram [ECG] [EKG]: Secondary | ICD-10-CM | POA: Insufficient documentation

## 2021-12-13 DIAGNOSIS — R1011 Right upper quadrant pain: Secondary | ICD-10-CM | POA: Diagnosis not present

## 2021-12-13 DIAGNOSIS — R197 Diarrhea, unspecified: Secondary | ICD-10-CM | POA: Diagnosis not present

## 2021-12-13 DIAGNOSIS — Z01818 Encounter for other preprocedural examination: Secondary | ICD-10-CM | POA: Insufficient documentation

## 2021-12-13 DIAGNOSIS — Z01812 Encounter for preprocedural laboratory examination: Secondary | ICD-10-CM

## 2021-12-14 ENCOUNTER — Encounter: Payer: Self-pay | Admitting: Gastroenterology

## 2021-12-14 ENCOUNTER — Ambulatory Visit
Admission: RE | Admit: 2021-12-14 | Discharge: 2021-12-14 | Disposition: A | Discharge status: Home / Self Care | Payer: BC Managed Care – PPO | Source: Ambulatory Visit | Attending: Gastroenterology | Admitting: Gastroenterology

## 2021-12-14 ENCOUNTER — Encounter
Admission: RE | Disposition: A | Discharge status: Home / Self Care | Payer: Self-pay | Source: Ambulatory Visit | Attending: Gastroenterology

## 2021-12-14 ENCOUNTER — Encounter: Payer: Self-pay | Admitting: Anesthesiology

## 2021-12-14 ENCOUNTER — Ambulatory Visit: Payer: Self-pay | Admitting: Anesthesiology

## 2021-12-14 DIAGNOSIS — K529 Noninfective gastroenteritis and colitis, unspecified: Secondary | ICD-10-CM

## 2021-12-14 DIAGNOSIS — R197 Diarrhea, unspecified: Secondary | ICD-10-CM | POA: Insufficient documentation

## 2021-12-14 DIAGNOSIS — R1011 Right upper quadrant pain: Secondary | ICD-10-CM | POA: Insufficient documentation

## 2021-12-14 DIAGNOSIS — R9431 Abnormal electrocardiogram [ECG] [EKG]: Secondary | ICD-10-CM | POA: Insufficient documentation

## 2021-12-14 DIAGNOSIS — Z1211 Encounter for screening for malignant neoplasm of colon: Secondary | ICD-10-CM | POA: Diagnosis not present

## 2021-12-14 HISTORY — PX: ESOPHAGOGASTRODUODENOSCOPY (EGD) WITH PROPOFOL: SHX5813

## 2021-12-14 HISTORY — PX: COLONOSCOPY WITH PROPOFOL: SHX5780

## 2021-12-14 SURGERY — COLONOSCOPY WITH PROPOFOL
Anesthesia: General

## 2021-12-14 MED ORDER — PROPOFOL 500 MG/50ML IV EMUL
INTRAVENOUS | Status: DC | PRN
  Administered 2021-12-14: 120 ug/kg/min via INTRAVENOUS

## 2021-12-14 MED ORDER — SODIUM CHLORIDE 0.9 % IV SOLN
INTRAVENOUS | Status: DC
  Administered 2021-12-14: 1000 mL via INTRAVENOUS

## 2021-12-14 MED ORDER — PROPOFOL 10 MG/ML IV BOLUS
INTRAVENOUS | Status: AC
  Filled 2021-12-14: qty 20

## 2021-12-14 MED ORDER — DEXMEDETOMIDINE HCL IN NACL 200 MCG/50ML IV SOLN
INTRAVENOUS | Status: DC | PRN
  Administered 2021-12-14: 8 ug via INTRAVENOUS

## 2021-12-14 MED ORDER — FAMOTIDINE 20 MG PO TABS: 20.0000 mg | ORAL_TABLET | Freq: Once | ORAL | Status: DC

## 2021-12-14 MED ORDER — PROPOFOL 10 MG/ML IV BOLUS
INTRAVENOUS | Status: DC | PRN
  Administered 2021-12-14: 30 mg via INTRAVENOUS
  Administered 2021-12-14: 80 mg via INTRAVENOUS
  Administered 2021-12-14: 20 mg via INTRAVENOUS

## 2021-12-14 MED ORDER — LIDOCAINE HCL (CARDIAC) PF 100 MG/5ML IV SOSY
PREFILLED_SYRINGE | INTRAVENOUS | Status: DC | PRN
  Administered 2021-12-14: 50 mg via INTRAVENOUS

## 2021-12-14 NOTE — Transfer of Care (Signed)
Immediate Anesthesia Transfer of Care Note  Patient: Carolyn Walton  Procedure(s) Performed: COLONOSCOPY WITH PROPOFOL ESOPHAGOGASTRODUODENOSCOPY (EGD) WITH PROPOFOL  Patient Location: PACU and Endoscopy Unit  Anesthesia Type:General  Level of Consciousness: drowsy and patient cooperative  Airway & Oxygen Therapy: Patient Spontanous Breathing  Post-op Assessment: Report given to RN and Post -op Vital signs reviewed and stable  Post vital signs: Reviewed and stable  Last Vitals:  Vitals Value Taken Time  BP 117/59 12/14/21 1213  Temp    Pulse 74 12/14/21 1213  Resp 19 12/14/21 1213  SpO2 99 % 12/14/21 1213  Vitals shown include unvalidated device data.  Last Pain:  Vitals:   12/14/21 1042  TempSrc: Temporal  PainSc: 0-No pain         Complications: No notable events documented.

## 2021-12-14 NOTE — Anesthesia Postprocedure Evaluation (Signed)
Anesthesia Post Note  Patient: [name]  Procedure(s) Performed: COLONOSCOPY WITH PROPOFOL ESOPHAGOGASTRODUODENOSCOPY (EGD) WITH PROPOFOL  Patient location during evaluation: Endoscopy Anesthesia Type: General Level of consciousness: awake and alert Pain management: pain level controlled Vital Signs Assessment: post-procedure vital signs reviewed and stable Respiratory status: spontaneous breathing, nonlabored ventilation, respiratory function stable and patient connected to nasal cannula oxygen Cardiovascular status: blood pressure returned to baseline and stable Postop Assessment: no apparent nausea or vomiting Anesthetic complications: no   No notable events documented.   Last Vitals:  Vitals:   12/14/21 1223 12/14/21 1234  BP: 127/68 (!) 134/58  Pulse: 61 (!) 56  Resp: 20 18  Temp:    SpO2: 99% 100%    Last Pain:  Vitals:   12/14/21 1223  TempSrc:   PainSc: 0-No pain                 Corinda Gubler

## 2021-12-14 NOTE — Anesthesia Preprocedure Evaluation (Signed)
Anesthesia Evaluation  Patient identified by MRN, date of birth, ID band Patient awake    Reviewed: Allergy & Precautions, NPO status , Patient's Chart, lab work & pertinent test results  History of Anesthesia Complications Negative for: history of anesthetic complications  Airway Mallampati: II  TM Distance: >3 FB Neck ROM: Full    Dental no notable dental hx. (+) Teeth Intact   Pulmonary asthma , neg sleep apnea, neg COPD, Patient abstained from smoking.Not current smoker   Pulmonary exam normal breath sounds clear to auscultation       Cardiovascular Exercise Tolerance: Good METS(-) hypertension(-) CAD and (-) Past MI negative cardio ROS (-) dysrhythmias  Rhythm:Regular Rate:Normal - Systolic murmurs    Neuro/Psych  Headaches PSYCHIATRIC DISORDERS Anxiety Depression       GI/Hepatic PUD,GERD  Medicated and Controlled,,(+)     (-) substance abuse    Endo/Other  neg diabetes  Morbid obesityLast taken ozempic 8 days ago. Denies GI symptoms today  Renal/GU negative Renal ROS     Musculoskeletal   Abdominal  (+) + obese  Peds  Hematology   Anesthesia Other Findings Past Medical History: No date: Allergy No date: Anxiety No date: Arthritis No date: Asthma No date: Depression No date: Frequent headaches No date: Pneumonia  Reproductive/Obstetrics                              Anesthesia Physical Anesthesia Plan  ASA: 3  Anesthesia Plan: General   Post-op Pain Management: Minimal or no pain anticipated   Induction: Intravenous  PONV Risk Score and Plan: 3 and Propofol infusion, TIVA and Ondansetron  Airway Management Planned: Nasal Cannula  Additional Equipment: None  Intra-op Plan:   Post-operative Plan:   Informed Consent: I have reviewed the patients History and Physical, chart, labs and discussed the procedure including the risks, benefits and alternatives for the  proposed anesthesia with the patient or authorized representative who has indicated his/her understanding and acceptance.     Dental advisory given  Plan Discussed with: CRNA and Surgeon  Anesthesia Plan Comments: (Discussed risks of anesthesia with patient, including possibility of difficulty with spontaneous ventilation under anesthesia necessitating airway intervention, PONV, and rare risks such as cardiac or respiratory or neurological events, and allergic reactions. Discussed the role of CRNA in patient's perioperative care. Patient understands. Patient informed about increased incidence of above perioperative risk due to high BMI. Patient understands.  )         Anesthesia Quick Evaluation

## 2021-12-14 NOTE — Op Note (Signed)
[facility] Patient Name: Carolyn Walton Procedure Date: 12/14/2021 11:31 AM MRN: [id number] Account #: [id number] Date of Birth: May 08, 1960 Admit Type: Outpatient Age: 61 Room: [facility] ENDO ROOM 4 Gender: Female Note Status: Finalized Instrument Name: Upper Endoscope [id number] Procedure:             Upper GI endoscopy Indications:           Abdominal pain in the right upper quadrant, Diarrhea Providers:             Toney Reil MD, MD Referring MD:          Ferdinand Lango. Sullivan Lone, MD (Referring MD) Medicines:             General Anesthesia Complications:         No immediate complications. Estimated blood loss: None. Procedure:             Pre-Anesthesia Assessment:                        - Prior to the procedure, a History and Physical was                         performed, and patient medications and allergies were                         reviewed. The patient is competent. The risks and                         benefits of the procedure and the sedation options and                         risks were discussed with the patient. All questions                         were answered and informed consent was obtained.                         Patient identification and proposed procedure were                         verified by the physician, the nurse, the                         anesthesiologist, the anesthetist and the technician                         in the pre-procedure area in the procedure room in the                         endoscopy suite. Mental Status Examination: alert and                         oriented. Airway Examination: normal oropharyngeal                         airway and neck mobility. Respiratory Examination:                         clear to auscultation. CV Examination: normal.  Prophylactic Antibiotics: The patient does not require                         prophylactic antibiotics. Prior Anticoagulants: The                          patient has taken no anticoagulant or antiplatelet                         agents. ASA Grade Assessment: III - A patient with                         severe systemic disease. After reviewing the risks and                         benefits, the patient was deemed in satisfactory                         condition to undergo the procedure. The anesthesia                         plan was to use general anesthesia. Immediately prior                         to administration of medications, the patient was                         re-assessed for adequacy to receive sedatives. The                         heart rate, respiratory rate, oxygen saturations,                         blood pressure, adequacy of pulmonary ventilation, and                         response to care were monitored throughout the                         procedure. The physical status of the patient was                         re-assessed after the procedure.                        After obtaining informed consent, the endoscope was                         passed under direct vision. Throughout the procedure,                         the patient's blood pressure, pulse, and oxygen                         saturations were monitored continuously. The Endoscope                         was introduced through the mouth, and advanced to the  second part of duodenum. The upper GI endoscopy was                         accomplished without difficulty. The patient tolerated                         the procedure well. Findings:      The duodenal bulb and second portion of the duodenum were normal.       Biopsies for histology were taken with a cold forceps for evaluation of       celiac disease.      The entire examined stomach was normal. Biopsies were taken with a cold       forceps for Helicobacter pylori testing.      The gastroesophageal junction and examined esophagus were normal.       Esophagogastric landmarks were identified: the gastroesophageal junction       was found at 31 cm from the incisors. Impression:            - Normal duodenal bulb and second portion of the                         duodenum. Biopsied.                        - Normal stomach. Biopsied.                        - Normal gastroesophageal junction and esophagus.                        - Esophagogastric landmarks identified. Recommendation:        - Await pathology results.                        - Proceed with colonoscopy as scheduled                        See colonoscopy report Procedure Code(s):     --- Professional ---                        [phone], Esophagogastroduodenoscopy, flexible,                         transoral; with biopsy, single or multiple Diagnosis Code(s):     --- Professional ---                        R10.11, Right upper quadrant pain                        R19.7, Diarrhea, unspecified CPT copyright 2022 American Medical Association. All rights reserved. The codes documented in this report are preliminary and upon coder review may  be revised to meet current compliance requirements. Dr. Libby Maw Toney Reil MD, MD 12/14/2021 11:47:36 AM This report has been signed electronically. Number of Addenda: 0 Note Initiated On: 12/14/2021 11:31 AM Estimated Blood Loss:  Estimated blood loss: none.      [facility]

## 2021-12-14 NOTE — H&P (Signed)
Arlyss Repress, MD [address]  [address]  [place], [place] [address]  Main: [phone]  Fax: [phone] Pager: [phone]  Primary Care Physician:  Maple Hudson., MD Primary Gastroenterologist:  Dr. Arlyss Repress  Pre-Procedure History & Physical: HPI:  Carolyn Walton is a 61 y.o. female is here for an endoscopy and colonoscopy.   Past Medical History:  Diagnosis Date   Allergy    Anxiety    Arthritis    Asthma    Depression    Frequent headaches    Pneumonia     Past Surgical History:  Procedure Laterality Date   CESAREAN SECTION     times 2   COLONOSCOPY     ESOPHAGOGASTRODUODENOSCOPY     KNEE ARTHROSCOPY WITH MEDIAL MENISECTOMY Left 06/17/2014   Procedure: KNEE ARTHROSCOPY WITH MEDIAL MENISECTOMY;  Surgeon: Juanell Fairly, MD;  Location: [facility] ORS;  Service: Orthopedics;  Laterality: Left;  partial medial menisectomy    Prior to Admission medications   Medication Sig Start Date End Date Taking? Authorizing Provider  ADVAIR DISKUS 250-50 MCG/ACT AEPB Inhale 1 puff into the lungs daily. 12/24/20  Yes Maple Hudson., MD  buPROPion (WELLBUTRIN XL) 150 MG 24 hr tablet Take 1 tablet (150 mg total) by mouth daily. 09/12/21  Yes Maple Hudson., MD  cetirizine (ZYRTEC) 10 MG tablet Take 10 mg by mouth as needed.   Yes [provider]  citalopram (CELEXA) 20 MG tablet  02/03/13  Yes [provider]  fluticasone (FLONASE) 50 MCG/ACT nasal spray Place 2 sprays into both nostrils daily. 09/15/21 03/14/22 Yes Dgayli, Lianne Bushy, MD  montelukast (SINGULAIR) 10 MG tablet Take 1 tablet (10 mg total) by mouth at bedtime. 05/20/21  Yes Maple Hudson., MD  Semaglutide-Weight Management [facility]) 1.7 MG/0.75ML SOAJ Inject 1.7 mg into the skin once a week. 11/22/21  Yes [name], PA-C  sertraline (ZOLOFT) 100 MG tablet Take 1 tablet (100 mg total) by mouth daily. 03/20/19  Yes Maple Hudson., MD  topiramate (TOPAMAX) 200  MG tablet Take 1 tablet (200 mg total) by mouth daily. 06/20/21  Yes Maple Hudson., MD  albuterol (VENTOLIN HFA) 108 (90 Base) MCG/ACT inhaler Inhale 2 puffs into the lungs every 4 (four) hours as needed for wheezing or shortness of breath. 01/31/17   Maple Hudson., MD  SUMAtriptan (IMITREX) 100 MG tablet Take 1 tablet (100 mg total) by mouth daily. May repeat in 2 hours if headache persists or recurs. 08/13/21   Maple Hudson., MD    Allergies as of 11/30/2021 - Review Complete 11/30/2021  Allergen Reaction Noted   Molds & smuts Other (See Comments) 11/18/2012   Other Other (See Comments) 11/18/2012    History reviewed. No pertinent family history.  Social History   Socioeconomic History   Marital status: Significant Other    Spouse name: Not on file   Number of children: Not on file   Years of education: Not on file   Highest education level: Not on file  Occupational History   Not on file  Tobacco Use   Smoking status: Never    Passive exposure: Never   Smokeless tobacco: Never  Vaping Use   Vaping Use: Never used  Substance and Sexual Activity   Alcohol use: Yes    Comment: wine occasionally   Drug use: No   Sexual activity: Yes    Birth control/protection: Post-menopausal, None  Other Topics  Concern   Not on file  Social History Narrative   Not on file   Social Determinants of Health   Financial Resource Strain: Not on file  Food Insecurity: Not on file  Transportation Needs: Not on file  Physical Activity: Not on file  Stress: Not on file  Social Connections: Not on file  Intimate Partner Violence: Not on file    Review of Systems: See HPI, otherwise negative ROS  Physical Exam: BP (!) 156/89   Pulse 71   Temp (!) 96.5 F (35.8 C) (Temporal)   Resp 18   Ht 5' (1.524 m)   Wt 113.6 kg   SpO2 100%   BMI 48.92 kg/m  General:   Alert,  pleasant and cooperative in NAD Head:  Normocephalic and atraumatic. Neck:  Supple; no  masses or thyromegaly. Lungs:  Clear throughout to auscultation.    Heart:  Regular rate and rhythm. Abdomen:  Soft, nontender and nondistended. Normal bowel sounds, without guarding, and without rebound.   Neurologic:  Alert and  oriented x4;  grossly normal neurologically.  Impression/Plan: Carolyn Walton is here for an endoscopy and colonoscopy to be performed for RUQ pain, diarrhea, colon cancer screening  Risks, benefits, limitations, and alternatives regarding  endoscopy and colonoscopy have been reviewed with the patient.  Questions have been answered.  All parties agreeable.   Lannette Donath, MD  12/14/2021, 11:26 AM

## 2021-12-14 NOTE — Op Note (Signed)
[facility] Gastroenterology Patient Name: Carolyn Walton Procedure Date: 12/14/2021 11:30 AM MRN: [id number] Account #: [id number] Date of Birth: 1960/03/31 Admit Type: Outpatient Age: 61 Room: [facility] ENDO ROOM 4 Gender: Female Note Status: Finalized Instrument Name: Prentice Docker [id number] Procedure:             Colonoscopy Indications:           Screening for colorectal malignant neoplasm, This is                         the patient's first colonoscopy Providers:             Toney Reil MD, MD Referring MD:          Ferdinand Lango. Sullivan Lone, MD (Referring MD) Medicines:             General Anesthesia Complications:         No immediate complications. Estimated blood loss: None. Procedure:             Pre-Anesthesia Assessment:                        - Prior to the procedure, a History and Physical was                         performed, and patient medications and allergies were                         reviewed. The patient is competent. The risks and                         benefits of the procedure and the sedation options and                         risks were discussed with the patient. All questions                         were answered and informed consent was obtained.                         Patient identification and proposed procedure were                         verified by the physician, the nurse, the                         anesthesiologist, the anesthetist and the technician                         in the pre-procedure area in the procedure room in the                         endoscopy suite. Mental Status Examination: alert and                         oriented. Airway Examination: normal oropharyngeal                         airway and neck mobility. Respiratory Examination:  clear to auscultation. CV Examination: normal.                         Prophylactic Antibiotics: The patient does not require                          prophylactic antibiotics. Prior Anticoagulants: The                         patient has taken no anticoagulant or antiplatelet                         agents. ASA Grade Assessment: III - A patient with                         severe systemic disease. After reviewing the risks and                         benefits, the patient was deemed in satisfactory                         condition to undergo the procedure. The anesthesia                         plan was to use general anesthesia. Immediately prior                         to administration of medications, the patient was                         re-assessed for adequacy to receive sedatives. The                         heart rate, respiratory rate, oxygen saturations,                         blood pressure, adequacy of pulmonary ventilation, and                         response to care were monitored throughout the                         procedure. The physical status of the patient was                         re-assessed after the procedure.                        After obtaining informed consent, the colonoscope was                         passed under direct vision. Throughout the procedure,                         the patient's blood pressure, pulse, and oxygen                         saturations were monitored continuously. The  Colonoscope was introduced through the anus and                         advanced to the the terminal ileum, with                         identification of the appendiceal orifice and IC                         valve. The colonoscopy was performed without                         difficulty. The patient tolerated the procedure well.                         The quality of the bowel preparation was evaluated                         using the BBPS [facility] Bowel Preparation Scale) with                         scores of: Right Colon = 3, Transverse Colon = 3 and                         Left  Colon = 3 (entire mucosa seen well with no                         residual staining, small fragments of stool or opaque                         liquid). The total BBPS score equals 9. The terminal                         ileum, ileocecal valve, appendiceal orifice, and                         rectum were photographed. Findings:      The perianal and digital rectal examinations were normal. Pertinent       negatives include normal sphincter tone and no palpable rectal lesions.      Normal mucosa was found in the entire colon. Biopsies for histology were       taken with a cold forceps from the entire colon for evaluation of       microscopic colitis. Estimated blood loss: none.      The retroflexed view of the distal rectum and anal verge was normal and       showed no anal or rectal abnormalities.      The terminal ileum appeared normal. Impression:            - Normal mucosa in the entire examined colon. Biopsied.                        - The distal rectum and anal verge are normal on                         retroflexion view.                        -  The examined portion of the ileum was normal. Recommendation:        - Discharge patient to home (with escort).                        - Resume previous diet today.                        - Continue present medications.                        - Await pathology results.                        - Repeat colonoscopy in 10 years for screening                         purposes. Procedure Code(s):     --- Professional ---                        [phone], Colonoscopy, flexible; with biopsy, single or                         multiple Diagnosis Code(s):     --- Professional ---                        Z12.11, Encounter for screening for malignant neoplasm                         of colon CPT copyright 2022 American Medical Association. All rights reserved. The codes documented in this report are preliminary and upon coder review may  be revised to  meet current compliance requirements. Dr. Libby Maw Toney Reil MD, MD 12/14/2021 12:15:11 PM This report has been signed electronically. Number of Addenda: 0 Note Initiated On: 12/14/2021 11:30 AM Scope Withdrawal Time: 0 hours 14 minutes 3 seconds  Total Procedure Duration: 0 hours 17 minutes 15 seconds  Estimated Blood Loss:  Estimated blood loss: none. Estimated blood loss: none.      [facility]

## 2021-12-15 ENCOUNTER — Other Ambulatory Visit: Payer: Self-pay

## 2021-12-15 ENCOUNTER — Ambulatory Visit
Admission: RE | Admit: 2021-12-15 | Discharge: 2021-12-15 | Disposition: A | Discharge status: Home / Self Care | Payer: BC Managed Care – PPO | Attending: Surgery | Admitting: Surgery

## 2021-12-15 ENCOUNTER — Ambulatory Visit: Payer: BC Managed Care – PPO | Admitting: Urgent Care

## 2021-12-15 ENCOUNTER — Encounter: Payer: Self-pay | Admitting: Surgery

## 2021-12-15 ENCOUNTER — Encounter
Admission: RE | Disposition: A | Discharge status: Home / Self Care | Payer: Self-pay | Source: Home / Self Care | Attending: Surgery

## 2021-12-15 DIAGNOSIS — J45909 Unspecified asthma, uncomplicated: Secondary | ICD-10-CM | POA: Diagnosis not present

## 2021-12-15 DIAGNOSIS — F419 Anxiety disorder, unspecified: Secondary | ICD-10-CM | POA: Insufficient documentation

## 2021-12-15 DIAGNOSIS — K8010 Calculus of gallbladder with chronic cholecystitis without obstruction: Secondary | ICD-10-CM | POA: Insufficient documentation

## 2021-12-15 DIAGNOSIS — T884XXA Failed or difficult intubation, initial encounter: Secondary | ICD-10-CM | POA: Diagnosis not present

## 2021-12-15 DIAGNOSIS — F32A Depression, unspecified: Secondary | ICD-10-CM | POA: Diagnosis not present

## 2021-12-15 DIAGNOSIS — Y838 Other surgical procedures as the cause of abnormal reaction of the patient, or of later complication, without mention of misadventure at the time of the procedure: Secondary | ICD-10-CM | POA: Diagnosis not present

## 2021-12-15 DIAGNOSIS — Z8711 Personal history of peptic ulcer disease: Secondary | ICD-10-CM | POA: Insufficient documentation

## 2021-12-15 DIAGNOSIS — K824 Cholesterolosis of gallbladder: Secondary | ICD-10-CM

## 2021-12-15 DIAGNOSIS — K219 Gastro-esophageal reflux disease without esophagitis: Secondary | ICD-10-CM | POA: Insufficient documentation

## 2021-12-15 DIAGNOSIS — R519 Headache, unspecified: Secondary | ICD-10-CM | POA: Insufficient documentation

## 2021-12-15 LAB — SURGICAL PATHOLOGY

## 2021-12-15 SURGERY — CHOLECYSTECTOMY, ROBOT-ASSISTED, LAPAROSCOPIC
Anesthesia: General

## 2021-12-15 MED ORDER — FENTANYL CITRATE (PF) 100 MCG/2ML IJ SOLN
INTRAMUSCULAR | Status: DC | PRN
  Administered 2021-12-15 (×2): 50 ug via INTRAVENOUS

## 2021-12-15 MED ORDER — SUCCINYLCHOLINE CHLORIDE 200 MG/10ML IV SOSY
PREFILLED_SYRINGE | INTRAVENOUS | Status: DC | PRN
  Administered 2021-12-15: 140 mg via INTRAVENOUS

## 2021-12-15 MED ORDER — GABAPENTIN 300 MG PO CAPS
ORAL_CAPSULE | ORAL | Status: AC
  Administered 2021-12-15: 300 mg via ORAL
  Filled 2021-12-15: qty 1

## 2021-12-15 MED ORDER — PROPOFOL 10 MG/ML IV BOLUS
INTRAVENOUS | Status: DC | PRN
  Administered 2021-12-15: 150 mg via INTRAVENOUS

## 2021-12-15 MED ORDER — MIDAZOLAM HCL 2 MG/2ML IJ SOLN
INTRAMUSCULAR | Status: DC | PRN
  Administered 2021-12-15: 2 mg via INTRAVENOUS

## 2021-12-15 MED ORDER — CHLORHEXIDINE GLUCONATE 0.12 % MT SOLN: 15.0000 mL | Freq: Once | OROMUCOSAL | Status: AC

## 2021-12-15 MED ORDER — DEXMEDETOMIDINE HCL IN NACL 80 MCG/20ML IV SOLN
INTRAVENOUS | Status: DC | PRN
  Administered 2021-12-15: 8 ug via BUCCAL

## 2021-12-15 MED ORDER — GABAPENTIN 300 MG PO CAPS: 300.0000 mg | ORAL_CAPSULE | ORAL | Status: AC

## 2021-12-15 MED ORDER — ROCURONIUM BROMIDE 100 MG/10ML IV SOLN
INTRAVENOUS | Status: DC | PRN
  Administered 2021-12-15: 20 mg via INTRAVENOUS

## 2021-12-15 MED ORDER — FAMOTIDINE 20 MG PO TABS
ORAL_TABLET | ORAL | Status: AC
  Filled 2021-12-15: qty 1

## 2021-12-15 MED ORDER — CEFAZOLIN SODIUM-DEXTROSE 2-4 GM/100ML-% IV SOLN
2.0000 g | INTRAVENOUS | Status: AC
  Administered 2021-12-15: 2 g via INTRAVENOUS

## 2021-12-15 MED ORDER — OXYCODONE HCL 5 MG PO TABS
ORAL_TABLET | ORAL | Status: AC
  Administered 2021-12-15: 5 mg via ORAL
  Filled 2021-12-15: qty 1

## 2021-12-15 MED ORDER — LACTATED RINGERS IV SOLN: INTRAVENOUS | Status: DC

## 2021-12-15 MED ORDER — CHLORHEXIDINE GLUCONATE CLOTH 2 % EX PADS: 6.0000 | MEDICATED_PAD | Freq: Once | CUTANEOUS | Status: DC

## 2021-12-15 MED ORDER — PHENYLEPHRINE HCL (PRESSORS) 10 MG/ML IV SOLN
INTRAVENOUS | Status: DC | PRN
  Administered 2021-12-15: 80 ug via INTRAVENOUS
  Administered 2021-12-15: 160 ug via INTRAVENOUS
  Administered 2021-12-15 (×2): 80 ug via INTRAVENOUS
  Administered 2021-12-15: 160 ug via INTRAVENOUS

## 2021-12-15 MED ORDER — SUGAMMADEX SODIUM 200 MG/2ML IV SOLN
INTRAVENOUS | Status: DC | PRN
  Administered 2021-12-15: 200 mg via INTRAVENOUS

## 2021-12-15 MED ORDER — CEFAZOLIN SODIUM-DEXTROSE 2-4 GM/100ML-% IV SOLN
INTRAVENOUS | Status: AC
  Filled 2021-12-15: qty 100

## 2021-12-15 MED ORDER — INDOCYANINE GREEN 25 MG IV SOLR
2.5000 mg | INTRAVENOUS | Status: AC
  Administered 2021-12-15: 2.5 mg via INTRAVENOUS
  Filled 2021-12-15: qty 1

## 2021-12-15 MED ORDER — ACETAMINOPHEN 500 MG PO TABS: 1000.0000 mg | ORAL_TABLET | ORAL | Status: AC

## 2021-12-15 MED ORDER — FENTANYL CITRATE (PF) 100 MCG/2ML IJ SOLN
INTRAMUSCULAR | Status: AC
  Administered 2021-12-15: 25 ug via INTRAVENOUS
  Filled 2021-12-15: qty 2

## 2021-12-15 MED ORDER — ONDANSETRON HCL 4 MG/2ML IJ SOLN
INTRAMUSCULAR | Status: DC | PRN
  Administered 2021-12-15: 4 mg via INTRAVENOUS

## 2021-12-15 MED ORDER — ONDANSETRON HCL 4 MG/2ML IJ SOLN: 4.0000 mg | Freq: Once | INTRAMUSCULAR | Status: DC | PRN

## 2021-12-15 MED ORDER — FENTANYL CITRATE (PF) 100 MCG/2ML IJ SOLN
INTRAMUSCULAR | Status: AC
  Filled 2021-12-15: qty 2

## 2021-12-15 MED ORDER — OXYCODONE HCL 5 MG PO TABS: 5.0000 mg | ORAL_TABLET | Freq: Once | ORAL | Status: AC

## 2021-12-15 MED ORDER — ACETAMINOPHEN 500 MG PO TABS: 1000.0000 mg | ORAL_TABLET | Freq: Four times a day (QID) | ORAL | Status: DC | PRN

## 2021-12-15 MED ORDER — BUPIVACAINE LIPOSOME 1.3 % IJ SUSP: 20.0000 mL | Freq: Once | INTRAMUSCULAR | Status: DC

## 2021-12-15 MED ORDER — EPHEDRINE SULFATE (PRESSORS) 50 MG/ML IJ SOLN
INTRAMUSCULAR | Status: DC | PRN
  Administered 2021-12-15: 10 mg via INTRAVENOUS

## 2021-12-15 MED ORDER — FENTANYL CITRATE (PF) 100 MCG/2ML IJ SOLN
25.0000 ug | INTRAMUSCULAR | Status: DC | PRN
  Administered 2021-12-15: 25 ug via INTRAVENOUS

## 2021-12-15 MED ORDER — ACETAMINOPHEN 500 MG PO TABS
ORAL_TABLET | ORAL | Status: AC
  Filled 2021-12-15: qty 2

## 2021-12-15 MED ORDER — LIDOCAINE HCL (CARDIAC) PF 100 MG/5ML IV SOSY
PREFILLED_SYRINGE | INTRAVENOUS | Status: DC | PRN
  Administered 2021-12-15: 40 mg via INTRAVENOUS

## 2021-12-15 MED ORDER — OXYCODONE HCL 5 MG PO TABS: 5.0000 mg | ORAL_TABLET | ORAL | 0 refills | Status: DC | PRN

## 2021-12-15 MED ORDER — ACETAMINOPHEN 500 MG PO TABS
ORAL_TABLET | ORAL | Status: AC
  Administered 2021-12-15: 1000 mg via ORAL
  Filled 2021-12-15: qty 2

## 2021-12-15 MED ORDER — ORAL CARE MOUTH RINSE: 15.0000 mL | Freq: Once | OROMUCOSAL | Status: AC

## 2021-12-15 MED ORDER — CHLORHEXIDINE GLUCONATE 0.12 % MT SOLN
OROMUCOSAL | Status: AC
  Administered 2021-12-15: 15 mL via OROMUCOSAL
  Filled 2021-12-15: qty 15

## 2021-12-15 MED ORDER — MIDAZOLAM HCL 2 MG/2ML IJ SOLN
INTRAMUSCULAR | Status: AC
  Filled 2021-12-15: qty 2

## 2021-12-15 MED ORDER — DEXAMETHASONE SODIUM PHOSPHATE 10 MG/ML IJ SOLN
INTRAMUSCULAR | Status: DC | PRN
  Administered 2021-12-15: 10 mg via INTRAVENOUS

## 2021-12-15 MED ORDER — BUPIVACAINE-EPINEPHRINE (PF) 0.25% -1:200000 IJ SOLN
INTRAMUSCULAR | Status: DC | PRN
  Administered 2021-12-15: 30 mL

## 2021-12-15 MED ORDER — IBUPROFEN 600 MG PO TABS: 600.0000 mg | ORAL_TABLET | Freq: Three times a day (TID) | ORAL | 1 refills | Status: AC | PRN

## 2021-12-15 SURGICAL SUPPLY — 54 items
ADH SKN CLS APL DERMABOND .7 (GAUZE/BANDAGES/DRESSINGS) ×1
BAG PRESSURE INF REUSE 1000 (BAG) IMPLANT
CANNULA REDUC XI 12-8 STAPL (CANNULA) ×1
CANNULA REDUCER 12-8 DVNC XI (CANNULA) ×1 IMPLANT
CLIP LIGATING HEMO O LOK GREEN (MISCELLANEOUS) ×1 IMPLANT
CUP MEDICINE 2OZ PLAST GRAD ST (MISCELLANEOUS) ×1 IMPLANT
DERMABOND ADVANCED .7 DNX12 (GAUZE/BANDAGES/DRESSINGS) ×1 IMPLANT
DRAPE ARM DVNC X/XI (DISPOSABLE) ×4 IMPLANT
DRAPE COLUMN DVNC XI (DISPOSABLE) ×1 IMPLANT
DRAPE DA VINCI XI ARM (DISPOSABLE) ×4
DRAPE DA VINCI XI COLUMN (DISPOSABLE) ×1
ELECT CAUTERY BLADE TIP 2.5 (TIP) ×1
ELECT REM PT RETURN 9FT ADLT (ELECTROSURGICAL) ×1
ELECTRODE CAUTERY BLDE TIP 2.5 (TIP) ×1 IMPLANT
ELECTRODE REM PT RTRN 9FT ADLT (ELECTROSURGICAL) ×1 IMPLANT
GLOVE SURG SYN 7.0 (GLOVE) ×4 IMPLANT
GLOVE SURG SYN 7.0 PF PI (GLOVE) ×2 IMPLANT
GLOVE SURG SYN 7.5  E (GLOVE) ×4
GLOVE SURG SYN 7.5 E (GLOVE) ×4 IMPLANT
GLOVE SURG SYN 7.5 PF PI (GLOVE) ×2 IMPLANT
GOWN STRL REUS W/ TWL LRG LVL3 (GOWN DISPOSABLE) ×4 IMPLANT
GOWN STRL REUS W/TWL LRG LVL3 (GOWN DISPOSABLE) ×4
IRRIGATOR SUCT 8 DISP DVNC XI (IRRIGATION / IRRIGATOR) IMPLANT
IRRIGATOR SUCTION 8MM XI DISP (IRRIGATION / IRRIGATOR)
IV NS 1000ML (IV SOLUTION)
IV NS 1000ML BAXH (IV SOLUTION) IMPLANT
KIT PINK PAD W/HEAD ARE REST (MISCELLANEOUS) ×1
KIT PINK PAD W/HEAD ARM REST (MISCELLANEOUS) ×1 IMPLANT
LABEL OR SOLS (LABEL) ×1 IMPLANT
MANIFOLD NEPTUNE II (INSTRUMENTS) ×1 IMPLANT
NEEDLE HYPO 22GX1.5 SAFETY (NEEDLE) ×1 IMPLANT
NS IRRIG 500ML POUR BTL (IV SOLUTION) ×1 IMPLANT
OBTURATOR OPTICAL STANDARD 8MM (TROCAR) ×1
OBTURATOR OPTICAL STND 8 DVNC (TROCAR) ×1
OBTURATOR OPTICALSTD 8 DVNC (TROCAR) ×1 IMPLANT
PACK LAP CHOLECYSTECTOMY (MISCELLANEOUS) ×1 IMPLANT
PENCIL SMOKE EVACUATOR (MISCELLANEOUS) ×1 IMPLANT
SEAL CANN UNIV 5-8 DVNC XI (MISCELLANEOUS) ×3 IMPLANT
SEAL XI 5MM-8MM UNIVERSAL (MISCELLANEOUS) ×3
SET TUBE SMOKE EVAC HIGH FLOW (TUBING) ×1 IMPLANT
SOLUTION ELECTROLUBE (MISCELLANEOUS) ×1 IMPLANT
SPIKE FLUID TRANSFER (MISCELLANEOUS) ×1 IMPLANT
SPONGE T-LAP 18X18 ~~LOC~~+RFID (SPONGE) IMPLANT
SPONGE T-LAP 4X18 ~~LOC~~+RFID (SPONGE) ×1 IMPLANT
STAPLER CANNULA SEAL DVNC XI (STAPLE) ×1 IMPLANT
STAPLER CANNULA SEAL XI (STAPLE) ×1
SUT MNCRL AB 4-0 PS2 18 (SUTURE) ×1 IMPLANT
SUT VIC AB 3-0 SH 27 (SUTURE)
SUT VIC AB 3-0 SH 27X BRD (SUTURE) IMPLANT
SUT VICRYL 0 UR6 27IN ABS (SUTURE) ×2 IMPLANT
SYS BAG RETRIEVAL 10MM (BASKET) ×1
SYSTEM BAG RETRIEVAL 10MM (BASKET) ×1 IMPLANT
TRAP FLUID SMOKE EVACUATOR (MISCELLANEOUS) ×1 IMPLANT
WATER STERILE IRR 500ML POUR (IV SOLUTION) ×1 IMPLANT

## 2021-12-15 NOTE — Interval H&P Note (Signed)
History and Physical Interval Note:  12/15/2021 9:29 AM  Carolyn Walton  has presented today for surgery, with the diagnosis of gallbladder polyp.  The various methods of treatment have been discussed with the patient and family. After consideration of risks, benefits and other options for treatment, the patient has consented to  Procedure(s): XI ROBOTIC ASSISTED LAPAROSCOPIC CHOLECYSTECTOMY (N/A) INDOCYANINE GREEN FLUORESCENCE IMAGING (ICG) (N/A) as a surgical intervention.  The patient's history has been reviewed, patient examined, no change in status, stable for surgery.  I have reviewed the patient's chart and labs.  Questions were answered to the patient's satisfaction.     Saulo Anthis

## 2021-12-15 NOTE — Discharge Instructions (Addendum)
Difficult intubation letter needed to be provided to pt.    Bougie required even with McGrath videoscope used.   AMBULATORY SURGERY  DISCHARGE INSTRUCTIONS   The drugs that you were given will stay in your system until tomorrow so for the next 24 hours you should not:  Drive an automobile Make any legal decisions Drink any alcoholic beverage   You may resume regular meals tomorrow.  Today it is better to start with liquids and gradually work up to solid foods.  You may eat anything you prefer, but it is better to start with liquids, then soup and crackers, and gradually work up to solid foods.   Please notify your doctor immediately if you have any unusual bleeding, trouble breathing, redness and pain at the surgery site, drainage, fever, or pain not relieved by medication.    Additional Instructions:      Please contact your physician with any problems or Same Day Surgery at [phone], Monday through Friday 6 am to 4 pm, or [place] at [facility] number at [phone].

## 2021-12-15 NOTE — Anesthesia Procedure Notes (Addendum)
Procedure Name: Intubation Date/Time: 12/15/2021 12:56 PM  Performed by: Yevette Edwards, MDPre-anesthesia Checklist: Patient identified, Emergency Drugs available, Suction available, Patient being monitored and Timeout performed Patient Re-evaluated:Patient Re-evaluated prior to induction Oxygen Delivery Method: Circle system utilized Preoxygenation: Pre-oxygenation with 100% oxygen Induction Type: IV induction Ventilation: Mask ventilation without difficulty Laryngoscope Size: McGraph and 3 Grade View: Grade IV Tube type: Oral Tube size: 7.0 mm Number of attempts: 3 Airway Equipment and Method: Stylet and Bougie stylet Placement Confirmation: ETT inserted through vocal cords under direct vision Secured at: 22 (right lip) cm Tube secured with: Tape Dental Injury: Teeth and Oropharynx as per pre-operative assessment  Difficulty Due To: Difficulty was anticipated Future Recommendations: Recommend- induction with short-acting agent, and alternative techniques readily available and Recommend- awake intubation Comments: MP IV aw with short obese neck.  McGrath 4 blade used x 1 by CRNA with no view of aw structures.   Small mouth opening, crowded mouth space with redundant aw tissue.   Dr. Pernell Dupre present and attempted with same view with 4 blade.   Changed to McGrath 3 blade and no changes in view with or w/o cricoid manipulation.  Dr. Pernell Dupre placed bougie anteriorly w successful intubation.

## 2021-12-15 NOTE — Op Note (Signed)
  Procedure Date:  12/15/2021  Pre-operative Diagnosis:  Gallbladder polyps  Post-operative Diagnosis: Gallbladder polyps  Procedure:  Robotic assisted cholecystectomy with ICG FireFly cholangiogram  Surgeon:  Howie Ill, MD  Anesthesia:  General endotracheal  Estimated Blood Loss:  10 ml  Specimens:  gallbladder  Complications:  None  Indications for Procedure:  This is a 61 y.o. female who presents with abdominal pain and workup revealing gallbladder polyps.  The benefits, complications, treatment options, and expected outcomes were discussed with the patient. The risks of bleeding, infection, recurrence of symptoms, failure to resolve symptoms, bile duct damage, bile duct leak, retained common bile duct stone, bowel injury, and need for further procedures were all discussed with the patient and she was willing to proceed.  Description of Procedure: The patient was correctly identified in the preoperative area and brought into the operating room.  The patient was placed supine with VTE prophylaxis in place.  Appropriate time-outs were performed.  Anesthesia was induced and the patient was intubated.  Appropriate antibiotics were infused.  The abdomen was prepped and draped in a sterile fashion. An infraumbilical incision was made. A cutdown technique was used to enter the abdominal cavity without injury, and a 12 mm robotic port was inserted.  Pneumoperitoneum was obtained with appropriate opening pressures.  Three 8-mm ports were placed in the mid abdomen at the level of the umbilicus under direct visualization.  The DaVinci platform was docked, camera targeted, and instruments were placed under direct visualization.  The gallbladder was identified.  The fundus was grasped and retracted cephalad.  Adhesions were lysed bluntly and with electrocautery. The infundibulum was grasped and retracted laterally, exposing the peritoneum overlying the gallbladder.  This was incised with  electrocautery and extended on either side of the gallbladder.  FireFly cholangiogram was then obtained, and we were able to clearly identify the cystic duct and common bile duct.  The cystic duct and cystic artery were carefully dissected with combination of cautery and blunt dissection.  Both were clipped twice proximally and once distally, cutting in between.  The gallbladder was taken from the gallbladder fossa in a retrograde fashion with electrocautery. The gallbladder was placed in an Endocatch bag. The liver bed was inspected and any bleeding was controlled with electrocautery. The right upper quadrant was then inspected again revealing intact clips, no bleeding, and no ductal injury.  The 8 mm ports were removed under direct visualization and the 12 mm port was removed.  The Endocatch bag was brought out via the umbilical incision. The fascial opening was closed using 0 vicryl suture.  Local anesthetic was infused in all incisions and the incisions were closed with 4-0 Monocryl.  The wounds were cleaned and sealed with DermaBond.  The patient was emerged from anesthesia and extubated and brought to the recovery room for further management.  The patient tolerated the procedure well and all counts were correct at the end of the case.   Howie Ill, MD

## 2021-12-15 NOTE — Anesthesia Preprocedure Evaluation (Signed)
Anesthesia Evaluation  Patient identified by MRN, date of birth, ID band Patient awake    Reviewed: Allergy & Precautions, H&P , NPO status , Patient's Chart, lab work & pertinent test results, reviewed documented beta blocker date and time   Airway Mallampati: III  TM Distance: >3 FB Neck ROM: full    Dental  (+) Teeth Intact   Pulmonary neg shortness of breath, asthma , pneumonia, resolved, neg recent URI   Pulmonary exam normal        Cardiovascular Exercise Tolerance: Good negative cardio ROS Normal cardiovascular exam Rhythm:regular Rate:Normal  Good exercise tolerance without anginal equivalents. EKG noted. ja   Neuro/Psych  Headaches PSYCHIATRIC DISORDERS Anxiety Depression       GI/Hepatic Neg liver ROS, PUD,GERD  Medicated,,  Endo/Other  negative endocrine ROS    Renal/GU negative Renal ROS  negative genitourinary   Musculoskeletal   Abdominal   Peds  Hematology negative hematology ROS (+)   Anesthesia Other Findings Past Medical History: No date: Allergy No date: Anxiety No date: Arthritis No date: Asthma No date: Depression No date: Frequent headaches No date: Pneumonia Past Surgical History: No date: CESAREAN SECTION     Comment:  times 2 No date: COLONOSCOPY 12/14/2021: COLONOSCOPY WITH PROPOFOL; N/A     Comment:  Procedure: COLONOSCOPY WITH PROPOFOL;  Surgeon: Toney Reil, MD;  Location: [facility] ENDOSCOPY;  Service:               Endoscopy;  Laterality: N/A; No date: ESOPHAGOGASTRODUODENOSCOPY 12/14/2021: ESOPHAGOGASTRODUODENOSCOPY (EGD) WITH PROPOFOL; N/A     Comment:  Procedure: ESOPHAGOGASTRODUODENOSCOPY (EGD) WITH               PROPOFOL;  Surgeon: Toney Reil, MD;  Location:               [facility] ENDOSCOPY;  Service: Endoscopy;  Laterality: N/A; 06/17/2014: KNEE ARTHROSCOPY WITH MEDIAL MENISECTOMY; Left     Comment:  Procedure: KNEE ARTHROSCOPY WITH MEDIAL  MENISECTOMY;                Surgeon: Juanell Fairly, MD;  Location: [facility] ORS;                Service: Orthopedics;  Laterality: Left;  partial medial               menisectomy BMI    Body Mass Index: 48.91 kg/m     Reproductive/Obstetrics negative OB ROS                             Anesthesia Physical Anesthesia Plan  ASA: 3  Anesthesia Plan: General ETT   Post-op Pain Management:    Induction:   PONV Risk Score and Plan: 4 or greater  Airway Management Planned:   Additional Equipment:   Intra-op Plan:   Post-operative Plan:   Informed Consent: I have reviewed the patients History and Physical, chart, labs and discussed the procedure including the risks, benefits and alternatives for the proposed anesthesia with the patient or authorized representative who has indicated his/her understanding and acceptance.     Dental Advisory Given  Plan Discussed with: CRNA  Anesthesia Plan Comments:        Anesthesia Quick Evaluation

## 2021-12-15 NOTE — Progress Notes (Signed)
  Dear Ms. [name],  During your anesthetic for your gallbladder surgery on 12/15/2021, your intubation was noted to be difficult.   Please let anesthesiologists in the future know this prior to any surgery or anesthetic. For the technical description, you can show them the following:  "Mallampati IV view. McGrath 3 and 4 blades used with no view of airway structures. Small mouth opening, crowded mouth space with redundant airway tissue. Ultimately intubated using bougie to facilitate."   Best,  Corinda Gubler, MD [facility] [employer] [facility]est[facility]

## 2021-12-15 NOTE — Transfer of Care (Addendum)
Immediate Anesthesia Transfer of Care Note  Patient: Carolyn Walton  Procedure(s) Performed: XI ROBOTIC ASSISTED LAPAROSCOPIC CHOLECYSTECTOMY INDOCYANINE GREEN FLUORESCENCE IMAGING (ICG)  Patient Location: PACU  Anesthesia Type:General  Level of Consciousness: sedated  Airway & Oxygen Therapy: Patient Spontanous Breathing  Post-op Assessment: Report given to RN  Post vital signs: Reviewed  Last Vitals:  Vitals Value Taken Time  BP 128/71 12/15/21 1435  Temp    Pulse 66 12/15/21 1439  Resp 17 12/15/21 1439  SpO2 100 % 12/15/21 1439  Vitals shown include unvalidated device data.  Last Pain:  Vitals:   12/15/21 0920  TempSrc: Oral  PainSc: 3          Complications:  Encounter Notable Events  Notable Event Outcome Phase Comment  Difficult to intubate - expected  Intraprocedure Filed from anesthesia note documentation.

## 2021-12-18 NOTE — Anesthesia Postprocedure Evaluation (Signed)
Anesthesia Post Note  Patient: Carolyn Walton  Procedure(s) Performed: XI ROBOTIC ASSISTED LAPAROSCOPIC CHOLECYSTECTOMY INDOCYANINE GREEN FLUORESCENCE IMAGING (ICG)  Patient location during evaluation: PACU Anesthesia Type: General Level of consciousness: awake and alert Pain management: pain level controlled Vital Signs Assessment: post-procedure vital signs reviewed and stable Respiratory status: spontaneous breathing, nonlabored ventilation, respiratory function stable and patient connected to nasal cannula oxygen Cardiovascular status: blood pressure returned to baseline and stable Postop Assessment: no apparent nausea or vomiting Anesthetic complications: yes   Encounter Notable Events  Notable Event Outcome Phase Comment  Difficult to intubate - expected  Intraprocedure Filed from anesthesia note documentation.     Last Vitals:  Vitals:   12/15/21 1614 12/15/21 1615  BP: 129/64 127/63  Pulse: 83 81  Resp: 14 15  Temp: 36.4 C   SpO2: 98% 98%    Last Pain:  Vitals:   12/16/21 1000  TempSrc:   PainSc: 0-No pain                 Yevette Edwards

## 2021-12-19 LAB — SURGICAL PATHOLOGY

## 2021-12-28 ENCOUNTER — Ambulatory Visit (INDEPENDENT_AMBULATORY_CARE_PROVIDER_SITE_OTHER): Payer: BC Managed Care – PPO | Admitting: Physician Assistant

## 2021-12-28 ENCOUNTER — Encounter: Payer: Self-pay | Admitting: Physician Assistant

## 2021-12-28 VITALS — BP 140/69 | HR 74 | Temp 98.0°F | Ht 60.0 in | Wt 250.0 lb

## 2021-12-28 DIAGNOSIS — Z09 Encounter for follow-up examination after completed treatment for conditions other than malignant neoplasm: Secondary | ICD-10-CM

## 2021-12-28 DIAGNOSIS — K824 Cholesterolosis of gallbladder: Secondary | ICD-10-CM

## 2021-12-28 NOTE — Progress Notes (Signed)
[facility] POST-OP OFFICE VISIT  12/28/2021  HPI: Carolyn Walton is a 61 y.o. female 13 days s/p robotic assisted laparoscopic cholecystectomy for gallbladder polyps with Dr Aleen Campi   She reports that she is doing very well Some mild umbilical soreness Otherwise no pain No fever, chills, nausea, emesis Bowel movements are at her baseline; limiting her fatty food intake No issues with incisions   Vital signs: BP (!) 140/69   Pulse 74   Temp 98 F (36.7 C)   Ht 5' (1.524 m)   Wt 250 lb (113.4 kg)   SpO2 99%   BMI 48.82 kg/m    Physical Exam: Constitutional: Well appearing female, NAD Abdomen: Soft, non-tender, non-distended, no rebound/guarding Skin: Laparoscopic incisions are healing well, no erythema or drainage   Assessment/Plan: This is a 61 y.o. female 13 days s/p robotic assisted laparoscopic cholecystectomy for gallbladder polyps with Dr Aleen Campi    - Pain control prn  - Reviewed wound care recommendation  - Reviewed lifting restrictions; 4 weeks total  - Reviewed surgical pathology; CCC  - She can follow up on as needed basis; She understands to call with questions/concerns  -- Lynden Oxford, PA-C [facility] 12/28/2021, 11:50 AM M-F: 7am - 4pm

## 2021-12-28 NOTE — Patient Instructions (Signed)
GENERAL POST-OPERATIVE PATIENT INSTRUCTIONS   WOUND CARE INSTRUCTIONS:  Try to keep the wound dry and avoid ointments on the wound unless directed to do so.  If the wound becomes bright red and painful or starts to drain infected material that is not clear, please contact your physician immediately.  If the wound is mildly pink and has a thick firm ridge underneath it, this is normal, and is referred to as a healing ridge.  This will resolve over the next 4-6 weeks.  BATHING: You may shower if you have been informed of this by your surgeon. However, Please do not submerge in a tub, hot tub, or pool until incisions are completely sealed or have been told by your surgeon that you may do so.  DIET:  You may eat any foods that you can tolerate.  It is a good idea to eat a high fiber diet and take in plenty of fluids to prevent constipation.  If you do become constipated you may want to take a mild laxative or take ducolax tablets on a daily basis until your bowel habits are regular.  Constipation can be very uncomfortable, along with straining, after recent surgery.  ACTIVITY:  You are encouraged to walk and engage in light activity for the next two weeks.  You should not lift more than 20 pounds for 6 weeks total after surgery as it could put you at increased risk for complications.  Twenty pounds is roughly equivalent to a plastic bag of groceries. At that time- Listen to your body when lifting, if you have pain when lifting, stop and then try again in a few days. Soreness after doing exercises or activities of daily living is normal as you get back in to your normal routine.  MEDICATIONS:  Try to take narcotic medications and anti-inflammatory medications, such as tylenol, ibuprofen, naprosyn, etc., with food.  This will minimize stomach upset from the medication.  Should you develop nausea and vomiting from the pain medication, or develop a rash, please discontinue the medication and contact your  physician.  You should not drive, make important decisions, or operate machinery when taking narcotic pain medication.  SUNBLOCK Use sun block to incision area over the next year if this area will be exposed to sun. This helps decrease scarring and will allow you avoid a permanent darkened area over your incision.  QUESTIONS:  Please feel free to call our office if you have any questions, and we will be glad to assist you. [phone]   

## 2021-12-29 ENCOUNTER — Encounter: Payer: BC Managed Care – PPO | Admitting: Physician Assistant

## 2022-01-25 ENCOUNTER — Telehealth: Payer: Self-pay

## 2022-01-25 NOTE — Telephone Encounter (Signed)
Copied from [place] [id number]. Topic: General - Other >> Jan 24, 2022  4:18 PM Carolyn Walton wrote: Reason for CRM: The patient has called to request completion of prior authorization for their Semaglutide-Weight Management (WEGOVY) 1.7 MG/0.75ML SOAJ [[id number]] prescription for a 90 day supply   Please contact the patient further if needed

## 2022-01-26 NOTE — Telephone Encounter (Signed)
The patient is calling back once again to check on the status of the prior auth so she can get her Semaglutide-Weight Management (WEGOVY) 1.7 MG/0.75ML SOAJ . Please assist patient further as soon as possible as she wants to get started on the medication as it has been 2 weeks without it.

## 2022-01-30 NOTE — Telephone Encounter (Signed)
Patient advised.

## 2022-03-23 ENCOUNTER — Ambulatory Visit: Payer: BC Managed Care – PPO | Admitting: Physician Assistant

## 2022-03-23 ENCOUNTER — Encounter: Payer: Self-pay | Admitting: Physician Assistant

## 2022-03-23 DIAGNOSIS — Z23 Encounter for immunization: Secondary | ICD-10-CM | POA: Diagnosis not present

## 2022-03-23 MED ORDER — WEGOVY 2.4 MG/0.75ML ~~LOC~~ SOAJ: 2.4000 mg | SUBCUTANEOUS | 84 refills | Status: DC

## 2022-03-23 NOTE — Assessment & Plan Note (Signed)
Increasing to 2.4 mg Pt will lose coverage 4/1 refilled for 3 months Unsure what our options are after this as pt has tried other methods and is not diabetic.  Can try to petition insurance or refer to medical weight loss clinic that compounds semaglutide, which is not ideal. Initial weight 270 lbs BMI 52.83 today 251 lbs

## 2022-03-23 NOTE — Progress Notes (Signed)
I,Sha'taria Tyson,acting as a Education administrator for [employer], PA-C.,have documented all relevant documentation on the behalf of Carolyn Kirschner, PA-C,as directed by  Carolyn Kirschner, PA-C while in the presence of Carolyn Kirschner, PA-C.   Established patient visit   Patient: Carolyn Walton   DOB: 09/18/1960   62 y.o. Female  MRN: [id number] Visit Date: 03/23/2022  Today's healthcare provider: [name], PA-C   Cc. Weight management  Subjective    HPI   Follow up for weight management  The patient was last seen for this 4 months ago. Changes made at last visit include advised increasing dose to 1.7 mg . She reports she will lose coverage 4/1 She reports excellent compliance with treatment. She feels that condition is Improved. She is not having side effects.  -----------------------------------------------------------------------------------------   Medications: Outpatient Medications Prior to Visit  Medication Sig   acetaminophen (TYLENOL) 500 MG tablet Take 2 tablets (1,000 mg total) by mouth every 6 (six) hours as needed for mild pain.   ADVAIR DISKUS 250-50 MCG/ACT AEPB Inhale 1 puff into the lungs daily.   albuterol (VENTOLIN HFA) 108 (90 Base) MCG/ACT inhaler Inhale 2 puffs into the lungs every 4 (four) hours as needed for wheezing or shortness of breath.   buPROPion (WELLBUTRIN XL) 150 MG 24 hr tablet Take 1 tablet (150 mg total) by mouth daily.   cetirizine (ZYRTEC) 10 MG tablet Take 10 mg by mouth as needed.   citalopram (CELEXA) 20 MG tablet    ibuprofen (ADVIL) 600 MG tablet Take 1 tablet (600 mg total) by mouth every 8 (eight) hours as needed for moderate pain.   montelukast (SINGULAIR) 10 MG tablet Take 1 tablet (10 mg total) by mouth at bedtime.   sertraline (ZOLOFT) 100 MG tablet Take 1 tablet (100 mg total) by mouth daily.   SUMAtriptan (IMITREX) 100 MG tablet Take 1 tablet (100 mg total) by mouth daily. May repeat in 2 hours if headache persists or recurs.    topiramate (TOPAMAX) 200 MG tablet Take 1 tablet (200 mg total) by mouth daily.   [DISCONTINUED] Semaglutide-Weight Management (WEGOVY) 1.7 MG/0.75ML SOAJ Inject 1.7 mg into the skin once a week.   fluticasone (FLONASE) 50 MCG/ACT nasal spray Place 2 sprays into both nostrils daily.   No facility-administered medications prior to visit.    Review of Systems  Constitutional:  Negative for fatigue and fever.  Respiratory:  Negative for cough and shortness of breath.   Cardiovascular:  Negative for chest pain and leg swelling.  Gastrointestinal:  Negative for abdominal pain.  Neurological:  Negative for dizziness and headaches.      Objective    BP 136/72 (BP Location: Left Arm, Patient Position: Sitting, Cuff Size: Large)   Pulse 79   Wt 251 lb 9.6 oz (114.1 kg)   SpO2 97%   BMI 49.14 kg/m    Physical Exam Vitals reviewed.  Constitutional:      Appearance: She is not ill-appearing.  HENT:     Head: Normocephalic.  Eyes:     Conjunctiva/sclera: Conjunctivae normal.  Cardiovascular:     Rate and Rhythm: Normal rate.  Pulmonary:     Effort: Pulmonary effort is normal. No respiratory distress.  Neurological:     General: No focal deficit present.     Mental Status: She is alert and oriented to person, place, and time.  Psychiatric:        Mood and Affect: Mood normal.  Behavior: Behavior normal.      No results found for any visits on 03/23/22.  Assessment & Plan     Problem List Items Addressed This Visit       Other   Morbid obesity ([place]) - Primary    Increasing to 2.4 mg Pt will lose coverage 4/1 refilled for 3 months Unsure what our options are after this as pt has tried other methods and is not diabetic.  Can try to petition insurance or refer to medical weight loss clinic that compounds semaglutide, which is not ideal. Initial weight 270 lbs BMI 52.83 today 251 lbs      Relevant Medications   Semaglutide-Weight Management (WEGOVY) 2.4 MG/0.75ML  SOAJ     Return in about 4 months (around 07/23/2022) for weight Management.      I, Carolyn Kirschner, PA-C have reviewed all documentation for this visit. The documentation on  03/23/22 for the exam, diagnosis, procedures, and orders are all accurate and complete.  Carolyn Kirschner, PA-C [facility] [address] [place], [place], [address] Office: [phone] Fax: [place]

## 2022-03-27 ENCOUNTER — Other Ambulatory Visit: Payer: Self-pay | Admitting: Physician Assistant

## 2022-03-27 DIAGNOSIS — F329 Major depressive disorder, single episode, unspecified: Secondary | ICD-10-CM

## 2022-03-27 NOTE — Telephone Encounter (Signed)
Medication Refill - Medication: buPROPion (WELLBUTRIN XL) 150 MG 24 hr tablet   Has the patient contacted their pharmacy? Yes.   (Agent: If no, request that the patient contact the pharmacy for the refill. If patient does not wish to contact the pharmacy document the reason why and proceed with request.) (Agent: If yes, when and what did the pharmacy advise?)  Preferred Pharmacy (with phone number or street name):  [facility] [place] Phone: [phone]  Fax: [phone]     Has the patient been seen for an appointment in the last year OR does the patient have an upcoming appointment? Yes.    Agent: Please be advised that RX refills may take up to 3 business days. We ask that you follow-up with your pharmacy.

## 2022-03-28 MED ORDER — BUPROPION HCL ER (XL) 150 MG PO TB24: 150.0000 mg | ORAL_TABLET | Freq: Every day | ORAL | 0 refills | Status: DC

## 2022-03-28 NOTE — Telephone Encounter (Signed)
Requested Prescriptions  Pending Prescriptions Disp Refills   buPROPion (WELLBUTRIN XL) 150 MG 24 hr tablet 90 tablet 1    Sig: Take 1 tablet (150 mg total) by mouth daily.     Psychiatry: Antidepressants - bupropion Passed - 03/27/2022  3:41 PM      Passed - Cr in normal range and within 360 days    Creatinine  Date Value Ref Range Status  10/10/2013 0.70 0.60 - 1.30 mg/dL Final   Creatinine, Ser  Date Value Ref Range Status  11/22/2021 0.78 0.57 - 1.00 mg/dL Final         Passed - AST in normal range and within 360 days    AST  Date Value Ref Range Status  11/22/2021 16 0 - 40 IU/L Final         Passed - ALT in normal range and within 360 days    ALT  Date Value Ref Range Status  11/22/2021 12 0 - 32 IU/L Final         Passed - Completed PHQ-2 or PHQ-9 in the last 360 days      Passed - Last BP in normal range    BP Readings from Last 1 Encounters:  03/23/22 136/72         Passed - Valid encounter within last 6 months    Recent Outpatient Visits           5 days ago Morbid obesity [facility])   [place] [name], PA-C   4 months ago Morbid obesity [facility])   [place] Thedore Mins, [place], PA-C   6 months ago BMI 50.0-59.9, adult [facility])   [place] Eulas Post, MD   7 months ago Moderate persistent asthmatic bronchitis with acute exacerbation   [place] Eulas Post, MD   8 months ago Mild intermittent asthmatic bronchitis with acute exacerbation   [place] Eulas Post, MD

## 2022-04-13 ENCOUNTER — Other Ambulatory Visit: Payer: Self-pay

## 2022-06-23 ENCOUNTER — Other Ambulatory Visit: Payer: Self-pay | Admitting: Physician Assistant

## 2022-06-23 DIAGNOSIS — F329 Major depressive disorder, single episode, unspecified: Secondary | ICD-10-CM

## 2022-06-24 ENCOUNTER — Other Ambulatory Visit: Payer: Self-pay | Admitting: Physician Assistant

## 2022-06-24 DIAGNOSIS — F329 Major depressive disorder, single episode, unspecified: Secondary | ICD-10-CM

## 2022-06-26 NOTE — Telephone Encounter (Signed)
Requested medication (s) are due for refill today: yes  Requested medication (s) are on the active medication list: yes  Last refill:  03/28/22  Future visit scheduled: no  Notes to clinic:  Unable to refill per protocol, Rx request was refused due to patient no longer under provider care, patient last OV 03/28/22, no other PCP in chart. Routing for review.      Requested Prescriptions  Pending Prescriptions Disp Refills   buPROPion (WELLBUTRIN XL) 150 MG 24 hr tablet [Pharmacy Med Name: BUPROPION HCL XL 150 MG TABLET] 90 tablet 0    Sig: Take 1 tablet (150 mg total) by mouth daily.     Psychiatry: Antidepressants - bupropion Passed - 06/24/2022 10:33 AM      Passed - Cr in normal range and within 360 days    Creatinine  Date Value Ref Range Status  10/10/2013 0.70 0.60 - 1.30 mg/dL Final   Creatinine, Ser  Date Value Ref Range Status  11/22/2021 0.78 0.57 - 1.00 mg/dL Final         Passed - AST in normal range and within 360 days    AST  Date Value Ref Range Status  11/22/2021 16 0 - 40 IU/L Final         Passed - ALT in normal range and within 360 days    ALT  Date Value Ref Range Status  11/22/2021 12 0 - 32 IU/L Final         Passed - Completed PHQ-2 or PHQ-9 in the last 360 days      Passed - Last BP in normal range    BP Readings from Last 1 Encounters:  03/23/22 136/72         Passed - Valid encounter within last 6 months    Recent Outpatient Visits           3 months ago Morbid obesity [facility])   [place] [facility] [name], PA-C   7 months ago Morbid obesity [facility])   [place] [facility] [place], [place], PA-C   9 months ago BMI 50.0-59.9, adult [facility])   [facility] Bosie Clos, MD   10 months ago Moderate persistent asthmatic bronchitis with acute exacerbation   [facility] Bosie Clos, MD   11 months ago Mild intermittent asthmatic bronchitis  with acute exacerbation   [facility] Bosie Clos, MD       Future Appointments             Tomorrow Raechel Chute, MD [facility]

## 2022-06-27 ENCOUNTER — Ambulatory Visit: Payer: BC Managed Care – PPO | Admitting: Student in an Organized Health Care Education/Training Program

## 2022-06-28 ENCOUNTER — Other Ambulatory Visit: Payer: Self-pay | Admitting: Physician Assistant

## 2022-06-28 ENCOUNTER — Other Ambulatory Visit: Payer: Self-pay | Admitting: Student in an Organized Health Care Education/Training Program

## 2022-06-28 DIAGNOSIS — F329 Major depressive disorder, single episode, unspecified: Secondary | ICD-10-CM

## 2022-06-28 DIAGNOSIS — J4540 Moderate persistent asthma, uncomplicated: Secondary | ICD-10-CM

## 2022-06-28 DIAGNOSIS — J301 Allergic rhinitis due to pollen: Secondary | ICD-10-CM

## 2022-06-28 MED ORDER — FLUTICASONE PROPIONATE 50 MCG/ACT NA SUSP: 2.0000 | Freq: Every day | NASAL | 2 refills | Status: AC

## 2022-06-30 ENCOUNTER — Other Ambulatory Visit: Payer: Self-pay | Admitting: Physician Assistant

## 2022-06-30 DIAGNOSIS — G43909 Migraine, unspecified, not intractable, without status migrainosus: Secondary | ICD-10-CM

## 2022-06-30 MED ORDER — TOPIRAMATE 200 MG PO TABS: 200.0000 mg | ORAL_TABLET | Freq: Every day | ORAL | 0 refills | Status: DC

## 2022-06-30 MED ORDER — SUMATRIPTAN SUCCINATE 100 MG PO TABS: 100.0000 mg | ORAL_TABLET | Freq: Every day | ORAL | 0 refills | Status: DC

## 2022-06-30 MED ORDER — BUPROPION HCL ER (XL) 150 MG PO TB24: 150.0000 mg | ORAL_TABLET | Freq: Every day | ORAL | 0 refills | Status: DC

## 2022-07-03 ENCOUNTER — Encounter: Payer: Self-pay | Admitting: Physician Assistant

## 2022-07-03 ENCOUNTER — Telehealth: Payer: Self-pay | Admitting: Physician Assistant

## 2022-07-03 ENCOUNTER — Ambulatory Visit: Payer: BC Managed Care – PPO | Admitting: Physician Assistant

## 2022-07-03 DIAGNOSIS — J45909 Unspecified asthma, uncomplicated: Secondary | ICD-10-CM | POA: Diagnosis not present

## 2022-07-03 DIAGNOSIS — F329 Major depressive disorder, single episode, unspecified: Secondary | ICD-10-CM

## 2022-07-03 DIAGNOSIS — G43909 Migraine, unspecified, not intractable, without status migrainosus: Secondary | ICD-10-CM | POA: Diagnosis not present

## 2022-07-03 MED ORDER — SUMATRIPTAN SUCCINATE 100 MG PO TABS: 100.0000 mg | ORAL_TABLET | Freq: Every day | ORAL | 3 refills | Status: AC

## 2022-07-03 MED ORDER — TOPIRAMATE 200 MG PO TABS: 200.0000 mg | ORAL_TABLET | Freq: Every day | ORAL | 3 refills | Status: AC

## 2022-07-03 MED ORDER — FLUTICASONE-SALMETEROL 250-50 MCG/ACT IN AEPB: 1.0000 | INHALATION_SPRAY | Freq: Every day | RESPIRATORY_TRACT | 2 refills | Status: AC

## 2022-07-03 MED ORDER — BUPROPION HCL ER (XL) 150 MG PO TB24: 150.0000 mg | ORAL_TABLET | Freq: Every day | ORAL | 3 refills | Status: AC

## 2022-07-03 MED ORDER — MONTELUKAST SODIUM 10 MG PO TABS: 10.0000 mg | ORAL_TABLET | Freq: Every day | ORAL | 3 refills | Status: AC

## 2022-07-03 MED ORDER — ALBUTEROL SULFATE HFA 108 (90 BASE) MCG/ACT IN AERS: 2.0000 | INHALATION_SPRAY | RESPIRATORY_TRACT | 4 refills | Status: AC | PRN

## 2022-07-03 NOTE — Assessment & Plan Note (Signed)
Stable managed with topamax 200 mg at bedtime and prn sumaptripan  refilled

## 2022-07-03 NOTE — Telephone Encounter (Signed)
Carolyn Walton (Key: [id number]) Rx #: [id number] Status: Sent to Plan today Drug:Fluticasone-Salmeterol 250-50MCG/ACT aerosol powder

## 2022-07-03 NOTE — Assessment & Plan Note (Signed)
Stable, Anxiety worse 2/2 wedding planning Reviewed some meditative/calming things before bed  Cont wellbutrin 150 mg

## 2022-07-03 NOTE — Telephone Encounter (Signed)
[facility] pharmacy is requesting prior authorization Key: [id number] Name: [place] Fluticasone-Salmeterol 250-50MCG/ACT Statistician

## 2022-07-03 NOTE — Progress Notes (Signed)
Established patient visit   Patient: Carolyn Walton   DOB: 1960/01/18   62 y.o. Female  MRN: [id number] Visit Date: 07/03/2022  Today's healthcare provider: [name], PA-C   Chief Complaint  Patient presents with   Medication Refill   Medical Management of Chronic Issues    Depression f.u last addressed 10 months ago   Subjective    HPI Migraines -Stable per pt , needs refills.  Depression, Follow-up  Pt reports her stress level is higher as she is planning her wedding. Reports not sleeping well.      07/03/2022   10:42 AM 07/03/2022   10:41 AM 03/23/2022    3:50 PM  Depression screen PHQ 2/9  Decreased Interest  0 0  Down, Depressed, Hopeless 1 1 1   PHQ - 2 Score 1 1 1   Altered sleeping 3  0  Tired, decreased energy 1  1  Change in appetite 0  0  Feeling bad or failure about yourself  0  0  Trouble concentrating 0  0  Moving slowly or fidgety/restless 0  0  Suicidal thoughts 0  0  PHQ-9 Score 5  2  Difficult doing work/chores Not difficult at all      -----------------------------------------------------------------------------------------  Medications: Outpatient Medications Prior to Visit  Medication Sig   cetirizine (ZYRTEC) 10 MG tablet Take 10 mg by mouth as needed.   fluticasone (FLONASE) 50 MCG/ACT nasal spray Place 2 sprays into both nostrils daily.   ibuprofen (ADVIL) 600 MG tablet Take 1 tablet (600 mg total) by mouth every 8 (eight) hours as needed for moderate pain.   [DISCONTINUED] ADVAIR DISKUS 250-50 MCG/ACT AEPB Inhale 1 puff into the lungs daily.   [DISCONTINUED] albuterol (VENTOLIN HFA) 108 (90 Base) MCG/ACT inhaler Inhale 2 puffs into the lungs every 4 (four) hours as needed for wheezing or shortness of breath.   [DISCONTINUED] buPROPion (WELLBUTRIN XL) 150 MG 24 hr tablet Take 1 tablet (150 mg total) by mouth daily.   [DISCONTINUED] montelukast (SINGULAIR) 10 MG tablet Take 1 tablet (10 mg total) by mouth at bedtime.    [DISCONTINUED] SUMAtriptan (IMITREX) 100 MG tablet Take 1 tablet (100 mg total) by mouth daily. May repeat in 2 hours if headache persists or recurs.   [DISCONTINUED] topiramate (TOPAMAX) 200 MG tablet Take 1 tablet (200 mg total) by mouth daily.   [DISCONTINUED] acetaminophen (TYLENOL) 500 MG tablet Take 2 tablets (1,000 mg total) by mouth every 6 (six) hours as needed for mild pain. (Patient not taking: Reported on 07/03/2022)   [DISCONTINUED] Semaglutide-Weight Management (WEGOVY) 2.4 MG/0.75ML SOAJ Inject 2.4 mg into the skin once a week. (Patient not taking: Reported on 07/03/2022)   [DISCONTINUED] sertraline (ZOLOFT) 100 MG tablet Take 1 tablet (100 mg total) by mouth daily. (Patient not taking: Reported on 07/03/2022)   No facility-administered medications prior to visit.    Review of Systems  Constitutional:  Negative for fatigue and fever.  Respiratory:  Negative for cough and shortness of breath.   Cardiovascular:  Negative for chest pain and leg swelling.  Gastrointestinal:  Negative for abdominal pain.  Neurological:  Negative for dizziness and headaches.  Psychiatric/Behavioral:  Positive for sleep disturbance.       Objective    BP 136/79 (BP Location: Left Arm, Patient Position: Sitting, Cuff Size: Large)   Pulse 68   Ht 5' (1.524 m)   SpO2 100%   BMI 49.14 kg/m   Physical Exam Vitals reviewed.  Constitutional:  Appearance: She is not ill-appearing.  HENT:     Head: Normocephalic.  Eyes:     Conjunctiva/sclera: Conjunctivae normal.  Cardiovascular:     Rate and Rhythm: Normal rate.  Pulmonary:     Effort: Pulmonary effort is normal. No respiratory distress.  Neurological:     General: No focal deficit present.     Mental Status: She is alert and oriented to person, place, and time.  Psychiatric:        Mood and Affect: Mood normal.        Behavior: Behavior normal.      No results found for any visits on 07/03/22.  Assessment & Plan     Problem List  Items Addressed This Visit       Cardiovascular and Mediastinum   Headache, migraine    Stable managed with topamax 200 mg at bedtime and prn sumaptripan  refilled      Relevant Medications   buPROPion (WELLBUTRIN XL) 150 MG 24 hr tablet   SUMAtriptan (IMITREX) 100 MG tablet   topiramate (TOPAMAX) 200 MG tablet     Respiratory   Airway hyperreactivity    Stable, compliant with inhaler use      Relevant Medications   fluticasone-salmeterol (ADVAIR DISKUS) 250-50 MCG/ACT AEPB   albuterol (VENTOLIN HFA) 108 (90 Base) MCG/ACT inhaler   montelukast (SINGULAIR) 10 MG tablet     Other   Clinical depression    Stable, Anxiety worse 2/2 wedding planning Reviewed some meditative/calming things before bed  Cont wellbutrin 150 mg       Relevant Medications   buPROPion (WELLBUTRIN XL) 150 MG 24 hr tablet     Return in about 6 months (around 01/03/2023) for chronic conditions, CPE.     I, [name], PA-C have reviewed all documentation for this visit. The documentation on  07/03/22   for the exam, diagnosis, procedures, and orders are all accurate and complete.  [name], PA-C [facility] [address] [place], [place], [address] Office: [phone] Fax: [phone]   [facility]

## 2022-07-03 NOTE — Assessment & Plan Note (Signed)
Stable, compliant with inhaler use

## 2022-07-13 ENCOUNTER — Encounter: Payer: Self-pay | Admitting: Student in an Organized Health Care Education/Training Program

## 2022-07-13 ENCOUNTER — Ambulatory Visit: Payer: BC Managed Care – PPO | Admitting: Student in an Organized Health Care Education/Training Program

## 2022-07-13 VITALS — BP 128/80 | HR 92 | Temp 97.8°F | Ht 60.0 in | Wt 263.0 lb

## 2022-07-13 DIAGNOSIS — J4540 Moderate persistent asthma, uncomplicated: Secondary | ICD-10-CM | POA: Diagnosis not present

## 2022-07-13 NOTE — Progress Notes (Signed)
Synopsis: Asthma follow up  Assessment & Plan:   #Moderate persistent asthma   Patient has a history of asthma and is maintained on Advair 250/50 one puff twice daily with excellent response. Her asthma is under control and she has no symptoms.  PFT previously noted normal spirometry but with significant response to bronchodilators. PFT's also notable for a very low ERV. Workup has included an allergen test that was negative and a CBC with differential with an eosinophil count of 400. Overall, data is consistent with a diagnosis of asthma and she has responded well to the combination of high dose ICS and intranasal steroids. We will continue advair as is as well as flonase and montelukast. Will consider stepping down therapy at follow up if remains in excellent control.  -Continue Advair 250/50 one puffs twice daily -Continue Flonase and montelukast daily -Continue attempts at weight loss    Return in about 6 months (around 01/13/2023).  I spent 25 minutes caring for this patient today, including preparing to see the patient, obtaining a medical history , reviewing a separately obtained history, performing a medically appropriate examination and/or evaluation, counseling and educating the patient/family/caregiver, and documenting clinical information in the electronic health record  Raechel Chute, MD [facility] 07/13/2022 2:43 PM    End of visit medications:  No orders of the defined types were placed in this encounter.    Current Outpatient Medications:    albuterol (VENTOLIN HFA) 108 (90 Base) MCG/ACT inhaler, Inhale 2 puffs into the lungs every 4 (four) hours as needed for wheezing or shortness of breath., Disp: 18 g, Rfl: 4   buPROPion (WELLBUTRIN XL) 150 MG 24 hr tablet, Take 1 tablet (150 mg total) by mouth daily., Disp: 90 tablet, Rfl: 3   cetirizine (ZYRTEC) 10 MG tablet, Take 10 mg by mouth as needed., Disp: , Rfl:    fluticasone (FLONASE) 50 MCG/ACT  nasal spray, Place 2 sprays into both nostrils daily., Disp: 16 g, Rfl: 2   fluticasone-salmeterol (ADVAIR DISKUS) 250-50 MCG/ACT AEPB, Inhale 1 puff into the lungs daily., Disp: 60 each, Rfl: 2   ibuprofen (ADVIL) 600 MG tablet, Take 1 tablet (600 mg total) by mouth every 8 (eight) hours as needed for moderate pain., Disp: 60 tablet, Rfl: 1   montelukast (SINGULAIR) 10 MG tablet, Take 1 tablet (10 mg total) by mouth at bedtime., Disp: 90 tablet, Rfl: 3   SUMAtriptan (IMITREX) 100 MG tablet, Take 1 tablet (100 mg total) by mouth daily. May repeat in 2 hours if headache persists or recurs., Disp: 9 tablet, Rfl: 3   topiramate (TOPAMAX) 200 MG tablet, Take 1 tablet (200 mg total) by mouth daily., Disp: 90 tablet, Rfl: 3   Subjective:   PATIENT ID: Carolyn Walton GENDER: female DOB: 08-Nov-1960, MRN: [id number]  Chief Complaint  Patient presents with   Follow-up    No SOB, wheezing or cough.     HPI  Carolyn Walton is a pleasant 62 year old female with a history of asthma (reports having the diagnosis placed in her 77s) who presents to clinic for follow up.   She feels great overall and her respiratory symptoms have pretty much subsided. She is compliant with Advair and denies any shortness of breath, cough, wheeze, chest pain, or chest tightness. She hasn't had to use her albuterol in a long time. Insurance is no longer covering Semaglutide and she's started to slowly gain back weight.   During our prior visits, patient reported a long standing  history of asthma and uncontrollable symptoms for a very long time. We had her on advair 250/50 two puffs and then subsequently went down to one puff twice daily. She continues on Flonase and montelukast as well. Blood work included an allergen screen and a PFT was obtained (showing significant response to bronchodilators). Peak eosinophil count was 400.    She has 2 dogs, 1 of which sheds heavily. She allows the dogs in her bedroom but not in her bed.  She  does not have any carpet in her house and does not endorse having any mold.  She tells me that she has significant sensitivity to mold and will cough whenever exposed to it.  She works as a Psychologist, forensic.  She does not smoke or vape and never has.  She does report a history of reflux.  Ancillary information including prior medications, full medical/surgical/family/social histories, and PFTs (when available) are listed below and have been reviewed.   Review of Systems  Constitutional:  Negative for chills and fever.  Respiratory:  Negative for cough, hemoptysis, sputum production, shortness of breath and wheezing.   Cardiovascular:  Negative for chest pain.  Gastrointestinal:  Negative for abdominal pain.     Objective:   Vitals:   07/13/22 1436  BP: 128/80  Pulse: 92  Temp: 97.8 F (36.6 C)  SpO2: 96%  Weight: 263 lb (119.3 kg)  Height: 5' (1.524 m)   96% on RA BMI Readings from Last 3 Encounters:  07/13/22 51.36 kg/m  07/03/22 49.14 kg/m  03/23/22 49.14 kg/m   Wt Readings from Last 3 Encounters:  07/13/22 263 lb (119.3 kg)  03/23/22 251 lb 9.6 oz (114.1 kg)  12/28/21 250 lb (113.4 kg)    Physical Exam Constitutional:      General: She is not in acute distress.    Appearance: Normal appearance. She is obese. She is not ill-appearing.  HENT:     Head: Normocephalic and atraumatic.  Cardiovascular:     Rate and Rhythm: Normal rate and regular rhythm.     Pulses: Normal pulses.     Heart sounds: Normal heart sounds.  Pulmonary:     Effort: Pulmonary effort is normal.     Breath sounds: Normal breath sounds. No wheezing, rhonchi or rales.  Abdominal:     General: There is distension.     Palpations: Abdomen is soft.  Neurological:     General: No focal deficit present.     Mental Status: She is alert and oriented to person, place, and time. Mental status is at baseline.       Ancillary Information    Past Medical History:  Diagnosis Date   Allergy     Anxiety    Arthritis    Asthma    Depression    Frequent headaches    Pneumonia      History reviewed. No pertinent family history.   Past Surgical History:  Procedure Laterality Date   CESAREAN SECTION     times 2   COLONOSCOPY     COLONOSCOPY WITH PROPOFOL N/A 12/14/2021   Procedure: COLONOSCOPY WITH PROPOFOL;  Surgeon: Toney Reil, MD;  Location: [facility];  Service: Endoscopy;  Laterality: N/A;   ESOPHAGOGASTRODUODENOSCOPY     ESOPHAGOGASTRODUODENOSCOPY (EGD) WITH PROPOFOL N/A 12/14/2021   Procedure: ESOPHAGOGASTRODUODENOSCOPY (EGD) WITH PROPOFOL;  Surgeon: Toney Reil, MD;  Location: [facility];  Service: Endoscopy;  Laterality: N/A;   KNEE ARTHROSCOPY WITH MEDIAL MENISECTOMY Left 06/17/2014   Procedure: KNEE  ARTHROSCOPY WITH MEDIAL MENISECTOMY;  Surgeon: Juanell Fairly, MD;  Location: [facility] OR[facility];  Service: Orthopedics;  Laterality: Left;  partial medial menisectomy    Social History   Socioeconomic History   Marital status: Significant Other    Spouse name: Not on file   Number of children: Not on file   Years of education: Not on file   Highest education level: Not on file  Occupational History   Not on file  Tobacco Use   Smoking status: Never    Passive exposure: Never   Smokeless tobacco: Never  Vaping Use   Vaping status: Never Used  Substance and Sexual Activity   Alcohol use: Yes    Comment: wine occasionally   Drug use: No   Sexual activity: Yes    Birth control/protection: Post-menopausal, None  Other Topics Concern   Not on file  Social History Narrative   Not on file   Social Determinants of Health   Financial Resource Strain: Not on file  Food Insecurity: Not on file  Transportation Needs: Not on file  Physical Activity: Not on file  Stress: Not on file  Social Connections: Not on file  Intimate Partner Violence: Not on file     Allergies  Allergen Reactions   Molds & Smuts Other (See Comments)    Sneezing    Other Other (See Comments)    CATS AND DOGS     CBC    Component Value Date/Time   WBC 7.0 11/22/2021 1621   WBC 7.6 08/17/2021 1145   RBC 4.98 11/22/2021 1621   RBC 4.93 08/17/2021 1145   HGB 14.3 11/22/2021 1621   HCT 43.2 11/22/2021 1621   PLT 192 11/22/2021 1621   MCV 87 11/22/2021 1621   MCV 88 10/10/2013 1131   MCH 28.7 11/22/2021 1621   MCH 28.2 08/17/2021 1145   MCHC 33.1 11/22/2021 1621   MCHC 31.8 08/17/2021 1145   RDW 12.5 11/22/2021 1621   RDW 13.5 10/10/2013 1131   LYMPHSABS 2.0 11/22/2021 1621   LYMPHSABS 1.7 10/10/2013 1131   MONOABS 0.5 08/17/2021 1145   MONOABS 0.4 10/10/2013 1131   EOSABS 0.2 11/22/2021 1621   EOSABS 0.3 10/10/2013 1131   BASOSABS 0.0 11/22/2021 1621   BASOSABS 0.0 10/10/2013 1131    Pulmonary Functions Testing Results:    Latest Ref Rng & Units 09/23/2021    3:53 PM  PFT Results  FVC-Pre L 2.43   FVC-Predicted Pre % 85   FVC-Post L 2.65   FVC-Predicted Post % 93   Pre FEV1/FVC % % 81   Post FEV1/FCV % % 86   FEV1-Pre L 1.97   FEV1-Predicted Pre % 90   FEV1-Post L 2.28   DLCO uncorrected ml/min/mmHg 22.05   DLCO UNC% % 125   DLVA Predicted % 109   TLC L 4.56   TLC % Predicted % 102   RV % Predicted % 81     Outpatient Medications Prior to Visit  Medication Sig Dispense Refill   albuterol (VENTOLIN HFA) 108 (90 Base) MCG/ACT inhaler Inhale 2 puffs into the lungs every 4 (four) hours as needed for wheezing or shortness of breath. 18 g 4   buPROPion (WELLBUTRIN XL) 150 MG 24 hr tablet Take 1 tablet (150 mg total) by mouth daily. 90 tablet 3   cetirizine (ZYRTEC) 10 MG tablet Take 10 mg by mouth as needed.     fluticasone (FLONASE) 50 MCG/ACT nasal spray Place 2 sprays into both nostrils daily. 16  g 2   fluticasone-salmeterol (ADVAIR DISKUS) 250-50 MCG/ACT AEPB Inhale 1 puff into the lungs daily. 60 each 2   ibuprofen (ADVIL) 600 MG tablet Take 1 tablet (600 mg total) by mouth every 8 (eight) hours as needed for moderate  pain. 60 tablet 1   montelukast (SINGULAIR) 10 MG tablet Take 1 tablet (10 mg total) by mouth at bedtime. 90 tablet 3   SUMAtriptan (IMITREX) 100 MG tablet Take 1 tablet (100 mg total) by mouth daily. May repeat in 2 hours if headache persists or recurs. 9 tablet 3   topiramate (TOPAMAX) 200 MG tablet Take 1 tablet (200 mg total) by mouth daily. 90 tablet 3   No facility-administered medications prior to visit.

## 2022-12-05 ENCOUNTER — Other Ambulatory Visit: Payer: Self-pay | Admitting: Physician Assistant

## 2022-12-05 DIAGNOSIS — G43909 Migraine, unspecified, not intractable, without status migrainosus: Secondary | ICD-10-CM

## 2023-06-17 ENCOUNTER — Other Ambulatory Visit: Payer: Self-pay | Admitting: Physician Assistant

## 2023-06-17 DIAGNOSIS — J45909 Unspecified asthma, uncomplicated: Secondary | ICD-10-CM

## 2023-06-17 DIAGNOSIS — G43909 Migraine, unspecified, not intractable, without status migrainosus: Secondary | ICD-10-CM

## 2023-06-19 ENCOUNTER — Other Ambulatory Visit: Payer: Self-pay | Admitting: Physician Assistant

## 2023-06-19 DIAGNOSIS — G43909 Migraine, unspecified, not intractable, without status migrainosus: Secondary | ICD-10-CM

## 2023-06-19 DIAGNOSIS — J45909 Unspecified asthma, uncomplicated: Secondary | ICD-10-CM

## 2023-06-28 ENCOUNTER — Other Ambulatory Visit: Payer: Self-pay | Admitting: Internal Medicine

## 2023-06-28 ENCOUNTER — Ambulatory Visit
Admission: RE | Admit: 2023-06-28 | Discharge: 2023-06-28 | Disposition: A | Discharge status: Home / Self Care | Source: Ambulatory Visit | Attending: Internal Medicine | Admitting: Internal Medicine

## 2023-06-28 DIAGNOSIS — M79661 Pain in right lower leg: Secondary | ICD-10-CM | POA: Insufficient documentation

## 2023-07-11 ENCOUNTER — Other Ambulatory Visit: Payer: Self-pay | Admitting: Physician Assistant

## 2023-07-11 ENCOUNTER — Other Ambulatory Visit (HOSPITAL_COMMUNITY): Payer: Self-pay | Admitting: Student

## 2023-07-11 DIAGNOSIS — J45909 Unspecified asthma, uncomplicated: Secondary | ICD-10-CM

## 2023-07-11 DIAGNOSIS — M79661 Pain in right lower leg: Secondary | ICD-10-CM

## 2023-07-18 ENCOUNTER — Other Ambulatory Visit: Payer: Self-pay | Admitting: Physician Assistant

## 2023-07-18 DIAGNOSIS — J45909 Unspecified asthma, uncomplicated: Secondary | ICD-10-CM

## 2023-12-28 ENCOUNTER — Other Ambulatory Visit: Payer: Self-pay | Admitting: Family Medicine

## 2023-12-28 DIAGNOSIS — Z1231 Encounter for screening mammogram for malignant neoplasm of breast: Secondary | ICD-10-CM
